# Patient Record
Sex: Male | Born: 1935 | Race: White | Hispanic: No | Marital: Married | Smoking: Never smoker
Health system: Southern US, Community
[De-identification: ages and names within clinical notes are randomized; demographics above are authoritative.]

## PROBLEM LIST (undated history)

## (undated) DIAGNOSIS — F0280 Dementia in other diseases classified elsewhere without behavioral disturbance: Secondary | ICD-10-CM

## (undated) DIAGNOSIS — E039 Hypothyroidism, unspecified: Secondary | ICD-10-CM

## (undated) DIAGNOSIS — I2510 Atherosclerotic heart disease of native coronary artery without angina pectoris: Secondary | ICD-10-CM

## (undated) DIAGNOSIS — M8580 Other specified disorders of bone density and structure, unspecified site: Secondary | ICD-10-CM

## (undated) DIAGNOSIS — K219 Gastro-esophageal reflux disease without esophagitis: Secondary | ICD-10-CM

## (undated) DIAGNOSIS — G309 Alzheimer's disease, unspecified: Secondary | ICD-10-CM

## (undated) DIAGNOSIS — I10 Essential (primary) hypertension: Secondary | ICD-10-CM

## (undated) HISTORY — DX: Hypothyroidism, unspecified: E03.9

## (undated) HISTORY — DX: Dementia in other diseases classified elsewhere without behavioral disturbance: F02.80

## (undated) HISTORY — DX: Atherosclerotic heart disease of native coronary artery without angina pectoris: I25.10

## (undated) HISTORY — DX: Other specified disorders of bone density and structure, unspecified site: M85.80

## (undated) HISTORY — DX: Gastro-esophageal reflux disease without esophagitis: K21.9

## (undated) HISTORY — DX: Essential (primary) hypertension: I10

## (undated) HISTORY — DX: Alzheimer's disease, unspecified: G30.9

---

## 1976-12-22 HISTORY — PX: CAROTID BODY TUMOR EXCISION: SHX5156

## 1977-12-22 HISTORY — PX: VASECTOMY: SHX75

## 1978-12-22 HISTORY — PX: OTHER SURGICAL HISTORY: SHX169

## 1992-12-22 HISTORY — PX: PROSTATECTOMY: SHX69

## 1997-09-21 HISTORY — PX: CORONARY ARTERY BYPASS GRAFT: SHX141

## 2000-01-27 ENCOUNTER — Observation Stay (HOSPITAL_COMMUNITY): Admission: EM | Admit: 2000-01-27 | Discharge: 2000-01-27 | Payer: Self-pay | Admitting: Cardiology

## 2001-04-05 ENCOUNTER — Other Ambulatory Visit: Admission: RE | Admit: 2001-04-05 | Discharge: 2001-04-05 | Payer: Self-pay | Admitting: Internal Medicine

## 2001-04-05 ENCOUNTER — Encounter (INDEPENDENT_AMBULATORY_CARE_PROVIDER_SITE_OTHER): Payer: Self-pay

## 2001-06-21 DIAGNOSIS — M8580 Other specified disorders of bone density and structure, unspecified site: Secondary | ICD-10-CM

## 2001-06-21 HISTORY — PX: OTHER SURGICAL HISTORY: SHX169

## 2001-06-21 HISTORY — DX: Other specified disorders of bone density and structure, unspecified site: M85.80

## 2002-12-22 HISTORY — PX: OTHER SURGICAL HISTORY: SHX169

## 2004-11-25 ENCOUNTER — Ambulatory Visit: Payer: Self-pay | Admitting: Internal Medicine

## 2004-12-22 HISTORY — PX: ESOPHAGOGASTRODUODENOSCOPY: SHX1529

## 2004-12-22 HISTORY — PX: OTHER SURGICAL HISTORY: SHX169

## 2005-01-07 ENCOUNTER — Ambulatory Visit: Payer: Self-pay

## 2005-05-16 ENCOUNTER — Ambulatory Visit: Payer: Self-pay | Admitting: Internal Medicine

## 2005-06-03 ENCOUNTER — Ambulatory Visit: Payer: Self-pay | Admitting: Internal Medicine

## 2005-07-03 ENCOUNTER — Ambulatory Visit: Payer: Self-pay | Admitting: Internal Medicine

## 2005-07-04 ENCOUNTER — Ambulatory Visit: Payer: Self-pay | Admitting: Internal Medicine

## 2005-07-11 ENCOUNTER — Ambulatory Visit: Payer: Self-pay | Admitting: Internal Medicine

## 2005-07-17 ENCOUNTER — Encounter: Payer: Self-pay | Admitting: Internal Medicine

## 2005-07-17 ENCOUNTER — Ambulatory Visit: Payer: Self-pay | Admitting: Internal Medicine

## 2005-09-21 HISTORY — PX: OTHER SURGICAL HISTORY: SHX169

## 2005-10-06 ENCOUNTER — Ambulatory Visit: Payer: Self-pay | Admitting: Cardiology

## 2005-10-06 ENCOUNTER — Observation Stay (HOSPITAL_COMMUNITY): Admission: EM | Admit: 2005-10-06 | Discharge: 2005-10-07 | Payer: Self-pay | Admitting: Emergency Medicine

## 2005-10-09 ENCOUNTER — Ambulatory Visit: Payer: Self-pay

## 2005-10-29 ENCOUNTER — Ambulatory Visit: Payer: Self-pay | Admitting: Cardiology

## 2005-12-29 ENCOUNTER — Ambulatory Visit: Payer: Self-pay | Admitting: Cardiology

## 2006-01-12 ENCOUNTER — Ambulatory Visit: Payer: Self-pay | Admitting: Internal Medicine

## 2006-01-20 ENCOUNTER — Ambulatory Visit: Payer: Self-pay | Admitting: Internal Medicine

## 2006-01-22 HISTORY — PX: OTHER SURGICAL HISTORY: SHX169

## 2006-06-12 ENCOUNTER — Ambulatory Visit: Payer: Self-pay | Admitting: Internal Medicine

## 2006-06-12 LAB — CONVERTED CEMR LAB: PSA: 0 ng/mL

## 2006-09-10 ENCOUNTER — Ambulatory Visit: Payer: Self-pay | Admitting: Internal Medicine

## 2006-09-21 HISTORY — PX: OTHER SURGICAL HISTORY: SHX169

## 2006-10-19 ENCOUNTER — Ambulatory Visit: Payer: Self-pay

## 2006-10-20 ENCOUNTER — Ambulatory Visit: Payer: Self-pay | Admitting: Internal Medicine

## 2006-10-23 ENCOUNTER — Ambulatory Visit: Payer: Self-pay | Admitting: Cardiology

## 2006-10-23 ENCOUNTER — Encounter: Payer: Self-pay | Admitting: Internal Medicine

## 2006-11-06 ENCOUNTER — Ambulatory Visit: Payer: Self-pay | Admitting: Internal Medicine

## 2006-11-26 ENCOUNTER — Ambulatory Visit: Payer: Self-pay | Admitting: Internal Medicine

## 2007-02-25 ENCOUNTER — Ambulatory Visit: Payer: Self-pay | Admitting: Internal Medicine

## 2007-03-05 ENCOUNTER — Ambulatory Visit: Payer: Self-pay | Admitting: Family Medicine

## 2007-04-01 ENCOUNTER — Encounter: Payer: Self-pay | Admitting: Internal Medicine

## 2007-04-01 DIAGNOSIS — M949 Disorder of cartilage, unspecified: Secondary | ICD-10-CM

## 2007-04-01 DIAGNOSIS — I10 Essential (primary) hypertension: Secondary | ICD-10-CM | POA: Insufficient documentation

## 2007-04-01 DIAGNOSIS — C61 Malignant neoplasm of prostate: Secondary | ICD-10-CM

## 2007-04-01 DIAGNOSIS — M899 Disorder of bone, unspecified: Secondary | ICD-10-CM | POA: Insufficient documentation

## 2007-04-01 DIAGNOSIS — I2510 Atherosclerotic heart disease of native coronary artery without angina pectoris: Secondary | ICD-10-CM | POA: Insufficient documentation

## 2007-04-01 DIAGNOSIS — E7800 Pure hypercholesterolemia, unspecified: Secondary | ICD-10-CM | POA: Insufficient documentation

## 2007-04-01 DIAGNOSIS — E039 Hypothyroidism, unspecified: Secondary | ICD-10-CM | POA: Insufficient documentation

## 2007-04-04 ENCOUNTER — Other Ambulatory Visit: Payer: Self-pay

## 2007-04-04 ENCOUNTER — Emergency Department: Payer: Medicare Other | Admitting: Emergency Medicine

## 2007-04-22 ENCOUNTER — Telehealth: Payer: Self-pay | Admitting: Internal Medicine

## 2007-04-28 ENCOUNTER — Telehealth (INDEPENDENT_AMBULATORY_CARE_PROVIDER_SITE_OTHER): Payer: Self-pay | Admitting: *Deleted

## 2007-04-28 ENCOUNTER — Ambulatory Visit: Payer: Self-pay | Admitting: Internal Medicine

## 2007-04-29 LAB — CONVERTED CEMR LAB
ALT: 20 units/L (ref 0–40)
Cholesterol: 227 mg/dL (ref 0–200)
Direct LDL: 130.4 mg/dL
HDL: 64.3 mg/dL (ref >39.0)
Total CHOL/HDL Ratio: 3.5
Triglycerides: 95 mg/dL (ref 0–149)
VLDL: 19 mg/dL (ref 0–40)

## 2007-04-30 ENCOUNTER — Telehealth: Payer: Self-pay | Admitting: Internal Medicine

## 2007-04-30 ENCOUNTER — Encounter: Payer: Self-pay | Admitting: Internal Medicine

## 2007-04-30 ENCOUNTER — Ambulatory Visit: Payer: Medicare Other | Admitting: Internal Medicine

## 2007-07-15 ENCOUNTER — Encounter: Payer: Self-pay | Admitting: Internal Medicine

## 2007-07-21 ENCOUNTER — Ambulatory Visit: Payer: Self-pay | Admitting: Internal Medicine

## 2007-07-24 ENCOUNTER — Encounter: Payer: Self-pay | Admitting: Internal Medicine

## 2007-07-26 ENCOUNTER — Encounter: Payer: Self-pay | Admitting: Internal Medicine

## 2007-07-28 ENCOUNTER — Encounter: Payer: Self-pay | Admitting: Internal Medicine

## 2007-08-02 ENCOUNTER — Encounter: Payer: Self-pay | Admitting: Internal Medicine

## 2007-08-06 ENCOUNTER — Encounter: Payer: Self-pay | Admitting: Internal Medicine

## 2007-08-09 ENCOUNTER — Encounter: Payer: Self-pay | Admitting: Internal Medicine

## 2007-08-14 ENCOUNTER — Encounter: Payer: Self-pay | Admitting: Internal Medicine

## 2007-08-20 ENCOUNTER — Encounter: Payer: Self-pay | Admitting: Internal Medicine

## 2007-08-24 ENCOUNTER — Encounter: Payer: Self-pay | Admitting: Internal Medicine

## 2007-11-11 ENCOUNTER — Encounter: Payer: Self-pay | Admitting: Internal Medicine

## 2007-11-24 ENCOUNTER — Ambulatory Visit: Payer: Self-pay | Admitting: Internal Medicine

## 2007-11-25 LAB — CONVERTED CEMR LAB
ALT: 21 units/L (ref 0–53)
Albumin: 3.9 g/dL (ref 3.5–5.2)
BUN: 8 mg/dL (ref 6–23)
CO2: 28 meq/L (ref 19–32)
Calcium: 9.5 mg/dL (ref 8.4–10.5)
Chloride: 105 meq/L (ref 96–112)
Cholesterol: 167 mg/dL (ref 0–200)
Creatinine, Ser: 0.9 mg/dL (ref 0.4–1.5)
GFR calc Af Amer: 105 mL/min
GFR calc non Af Amer: 87 mL/min
Glucose, Bld: 89 mg/dL (ref 70–99)
HDL: 59.1 mg/dL (ref >39.0)
LDL Cholesterol: 89 mg/dL (ref 0–99)
PSA: <0.03 ng/mL — ABNORMAL LOW (ref 0.10–4.00)
Phosphorus: 2.8 mg/dL (ref 2.3–4.6)
Potassium: 3.9 meq/L (ref 3.5–5.1)
Sodium: 141 meq/L (ref 135–145)
Total CHOL/HDL Ratio: 2.8
Triglycerides: 93 mg/dL (ref 0–149)
VLDL: 19 mg/dL (ref 0–40)

## 2008-01-02 ENCOUNTER — Emergency Department: Payer: Medicare Other | Admitting: Emergency Medicine

## 2008-01-02 ENCOUNTER — Other Ambulatory Visit: Payer: Self-pay

## 2008-02-11 ENCOUNTER — Ambulatory Visit: Payer: Self-pay | Admitting: Internal Medicine

## 2008-02-11 DIAGNOSIS — K219 Gastro-esophageal reflux disease without esophagitis: Secondary | ICD-10-CM | POA: Insufficient documentation

## 2008-02-22 ENCOUNTER — Telehealth (INDEPENDENT_AMBULATORY_CARE_PROVIDER_SITE_OTHER): Payer: Self-pay | Admitting: *Deleted

## 2008-02-23 ENCOUNTER — Telehealth (INDEPENDENT_AMBULATORY_CARE_PROVIDER_SITE_OTHER): Payer: Self-pay | Admitting: *Deleted

## 2008-03-23 ENCOUNTER — Ambulatory Visit: Payer: Self-pay | Admitting: Internal Medicine

## 2008-03-23 DIAGNOSIS — R109 Unspecified abdominal pain: Secondary | ICD-10-CM | POA: Insufficient documentation

## 2008-04-21 HISTORY — PX: ESOPHAGOGASTRODUODENOSCOPY: SHX1529

## 2008-04-25 ENCOUNTER — Ambulatory Visit: Payer: Self-pay | Admitting: Internal Medicine

## 2008-04-28 ENCOUNTER — Encounter: Payer: Self-pay | Admitting: Internal Medicine

## 2008-04-28 ENCOUNTER — Ambulatory Visit: Payer: Self-pay | Admitting: Internal Medicine

## 2008-05-24 ENCOUNTER — Ambulatory Visit: Payer: Self-pay | Admitting: Internal Medicine

## 2008-05-29 ENCOUNTER — Telehealth: Payer: Self-pay | Admitting: Internal Medicine

## 2008-09-28 ENCOUNTER — Ambulatory Visit: Payer: Self-pay | Admitting: Internal Medicine

## 2008-10-02 LAB — CONVERTED CEMR LAB
ALT: 18 units/L (ref 0–53)
AST: 25 units/L (ref 0–37)
Albumin: 4.2 g/dL (ref 3.5–5.2)
Alkaline Phosphatase: 58 units/L (ref 39–117)
Amylase: 92 units/L (ref 27–131)
BUN: 10 mg/dL (ref 6–23)
Basophils Absolute: 0 10*3/uL (ref 0.0–0.1)
Basophils Relative: 0 % (ref 0.0–3.0)
Bilirubin, Direct: 0.2 mg/dL (ref 0.0–0.3)
CO2: 30 meq/L (ref 19–32)
Calcium: 9.4 mg/dL (ref 8.4–10.5)
Chloride: 106 meq/L (ref 96–112)
Cholesterol: 156 mg/dL (ref 0–200)
Creatinine, Ser: 1.1 mg/dL (ref 0.4–1.5)
Eosinophils Absolute: 0.3 10*3/uL (ref 0.0–0.7)
Eosinophils Relative: 3.1 % (ref 0.0–5.0)
Free T4: 0.8 ng/dL (ref 0.6–1.6)
GFR calc Af Amer: 83 mL/min
GFR calc non Af Amer: 69 mL/min
Glucose, Bld: 89 mg/dL (ref 70–99)
HCT: 38.8 % — ABNORMAL LOW (ref 39.0–52.0)
HDL: 58.7 mg/dL (ref >39.0)
Hemoglobin: 13.2 g/dL (ref 13.0–17.0)
LDL Cholesterol: 73 mg/dL (ref 0–99)
Lipase: 32 units/L (ref 11.0–59.0)
Lymphocytes Relative: 20.7 % (ref 12.0–46.0)
MCHC: 34 g/dL (ref 30.0–36.0)
MCV: 96.3 fL (ref 78.0–100.0)
Monocytes Absolute: 0.5 10*3/uL (ref 0.1–1.0)
Monocytes Relative: 5.2 % (ref 3.0–12.0)
Neutro Abs: 6.3 10*3/uL (ref 1.4–7.7)
Neutrophils Relative %: 71 % (ref 43.0–77.0)
PSA: 0 ng/mL — ABNORMAL LOW (ref 0.10–4.00)
Phosphorus: 2.6 mg/dL (ref 2.3–4.6)
Platelets: 219 10*3/uL (ref 150–400)
Potassium: 3.8 meq/L (ref 3.5–5.1)
RBC: 4.03 M/uL — ABNORMAL LOW (ref 4.22–5.81)
RDW: 12.6 % (ref 11.5–14.6)
Sodium: 142 meq/L (ref 135–145)
TSH: 2.97 microintl units/mL (ref 0.35–5.50)
Total Bilirubin: 0.4 mg/dL (ref 0.3–1.2)
Total CHOL/HDL Ratio: 2.7
Total Protein: 6.8 g/dL (ref 6.0–8.3)
Triglycerides: 120 mg/dL (ref 0–149)
VLDL: 24 mg/dL (ref 0–40)
WBC: 9 10*3/uL (ref 4.5–10.5)

## 2008-10-04 ENCOUNTER — Ambulatory Visit: Payer: Self-pay | Admitting: Internal Medicine

## 2008-10-04 DIAGNOSIS — R51 Headache: Secondary | ICD-10-CM | POA: Insufficient documentation

## 2008-10-04 DIAGNOSIS — R519 Headache, unspecified: Secondary | ICD-10-CM | POA: Insufficient documentation

## 2009-01-13 IMAGING — CR DG CHEST 2V
1 series · 2 of 2 positions shown · non-contrast
Comparison: none

REASON FOR EXAM: RIGHT SIDE PAIN - RM 15
COMMENTS:

PROCEDURE:     DXR - DXR CHEST PA (OR AP) AND LATERAL  - April 04, 2007  [DATE]
RESULT:     Two views of the chest show degenerative changes in the spine.
CABG changes are present. COPD changes are present. There is scoliosis
concave to the LEFT. The bones are osteopenic.

[Series 1: view not recorded · 0.17mm/px · 2 of 2 slices shown]
[im 1/2]
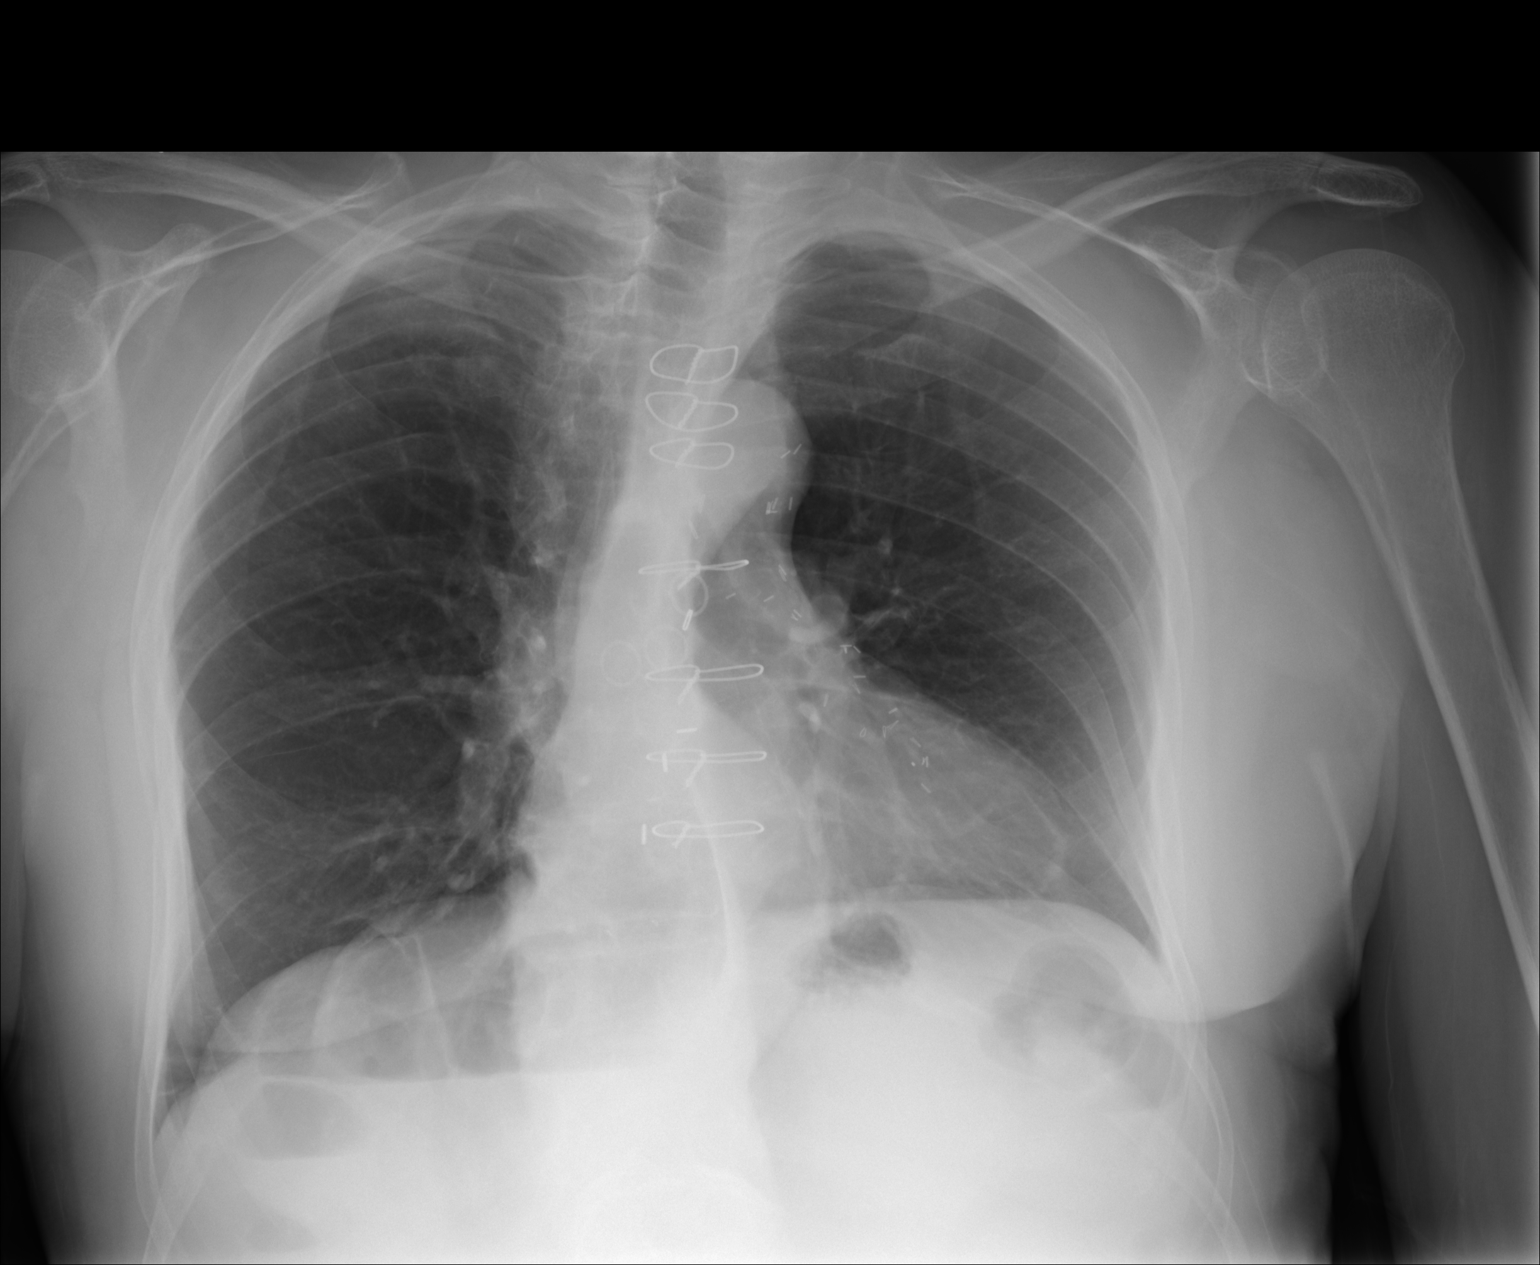
[im 2/2]
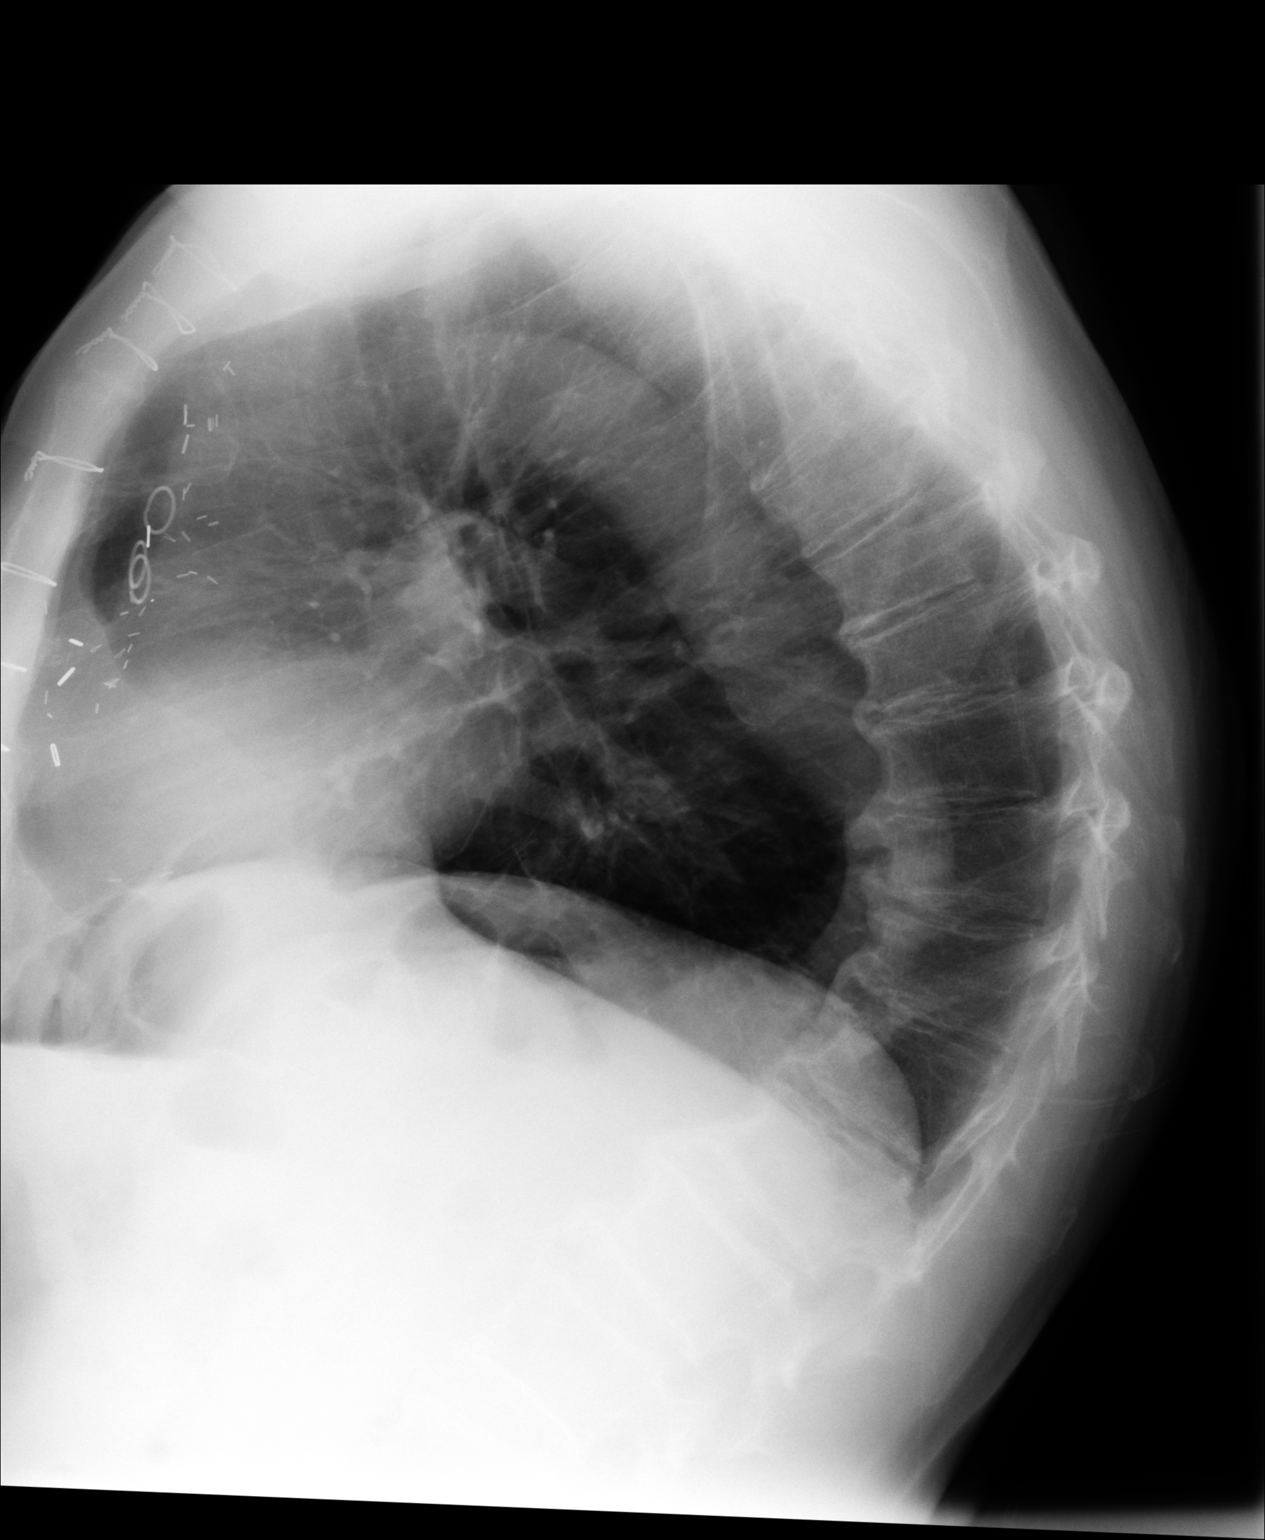

[2 of 2 positions shown; findings below may reference images not displayed]

IMPRESSION: 1. Scoliosis.
2. COPD.
3. CABG changes.
4. No acute abnormality evident.

## 2009-01-13 IMAGING — US ABDOMEN ULTRASOUND
1 series · 17 of 25 positions shown · non-contrast
Comparison: none

REASON FOR EXAM: RUQ PAIN, NPO SINCE MN; PLEASE ALSO EVALUATE ABDOMINAL
AORTA
COMMENTS:

[Series 1: abdomen ultrasound · 17 of 57 slices shown]
[im 1/57]
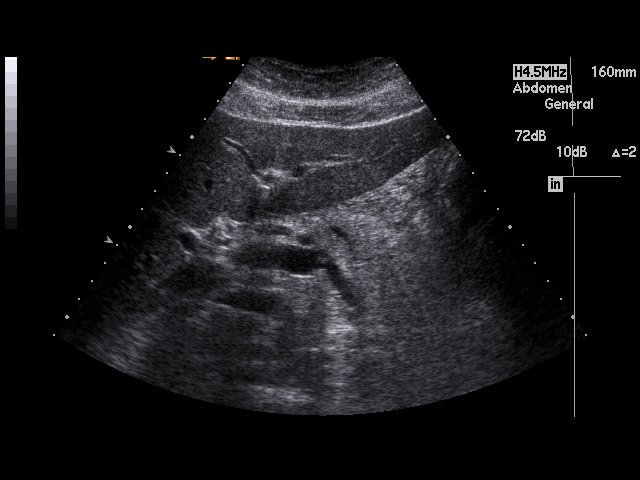
[im 5/57]
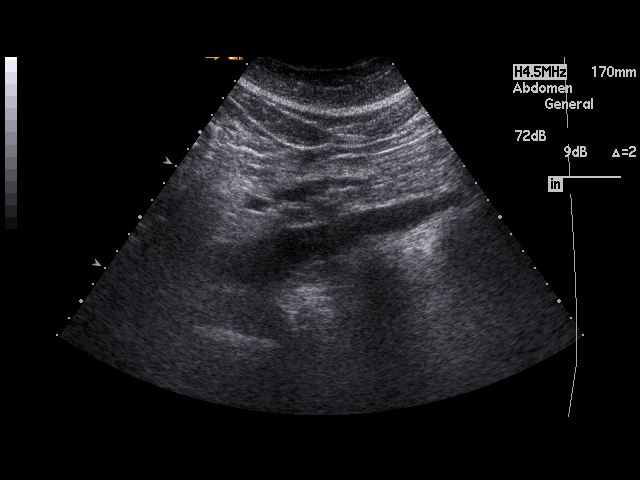
[im 8/57]
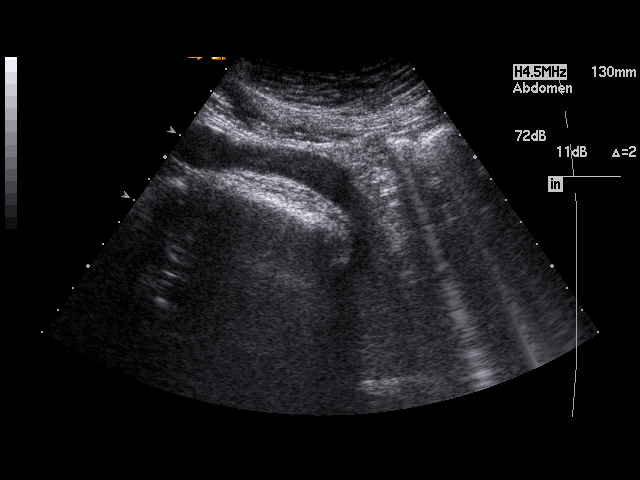
[im 12/57]
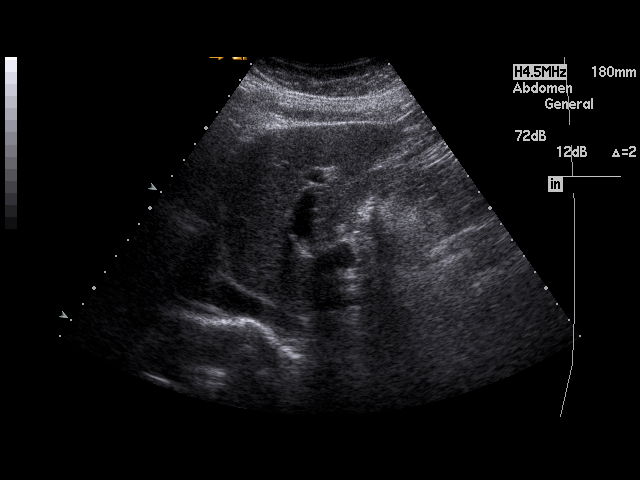
[im 15/57]
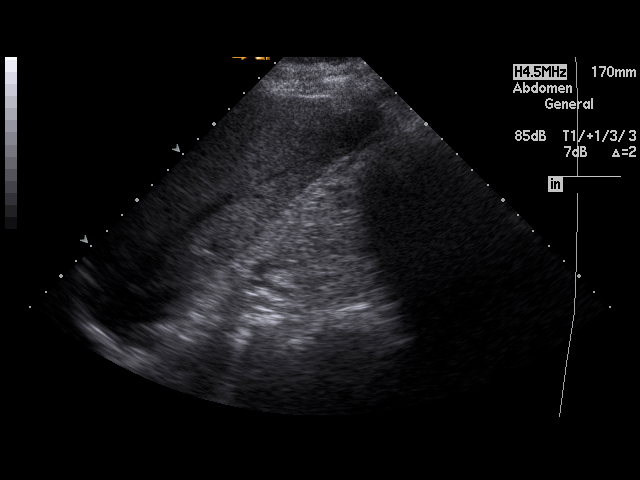
[im 19/57]
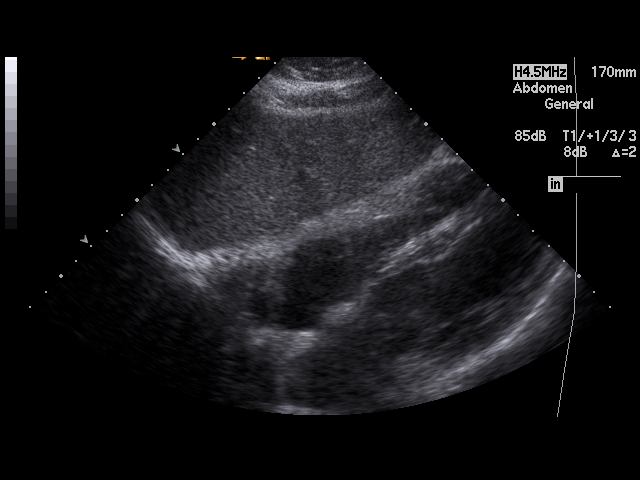
[im 22/57]
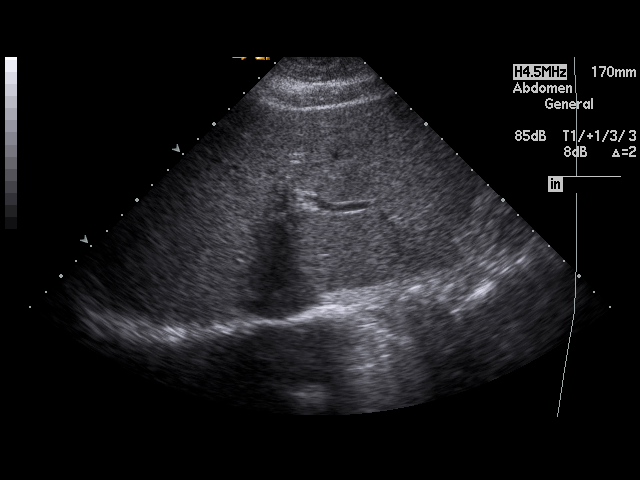
[im 26/57]
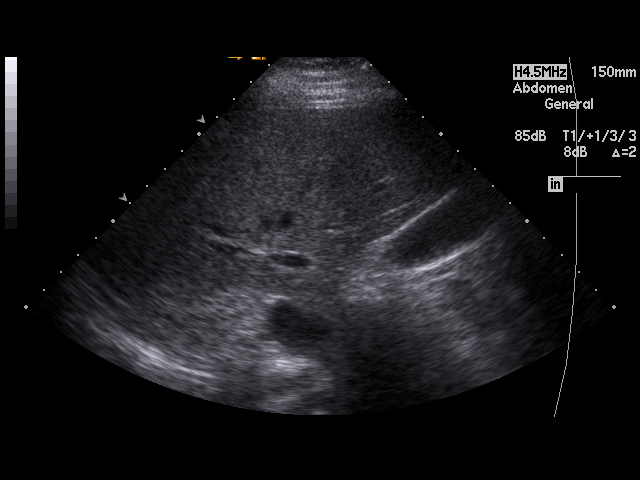
[im 29/57]
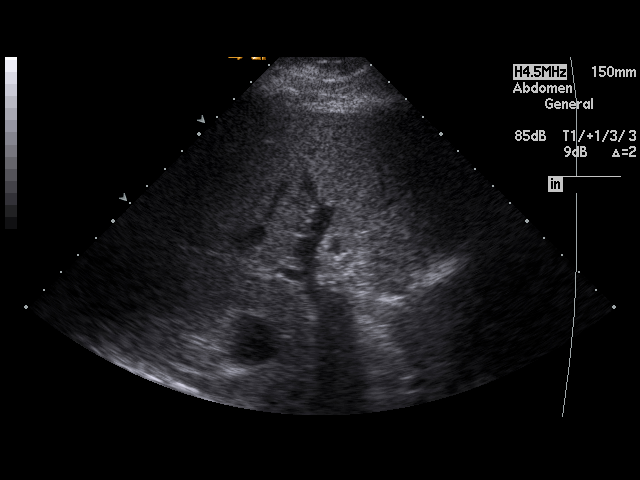
[im 31/57]
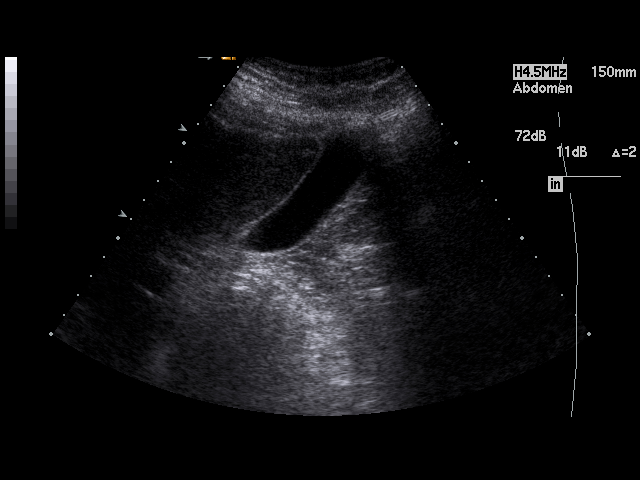
[im 36/57]
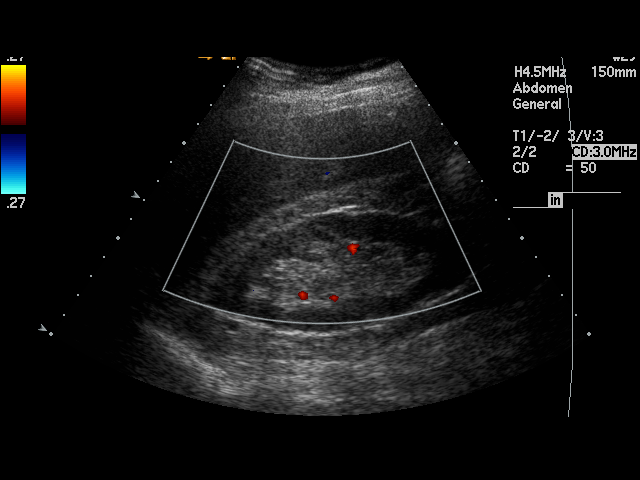
[im 38/57]
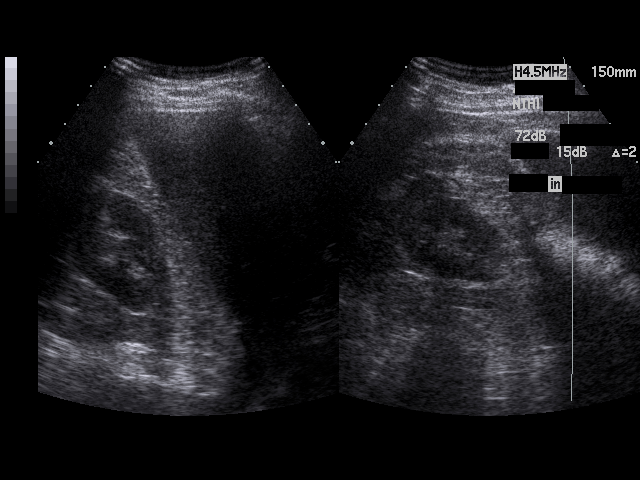
[im 43/57]
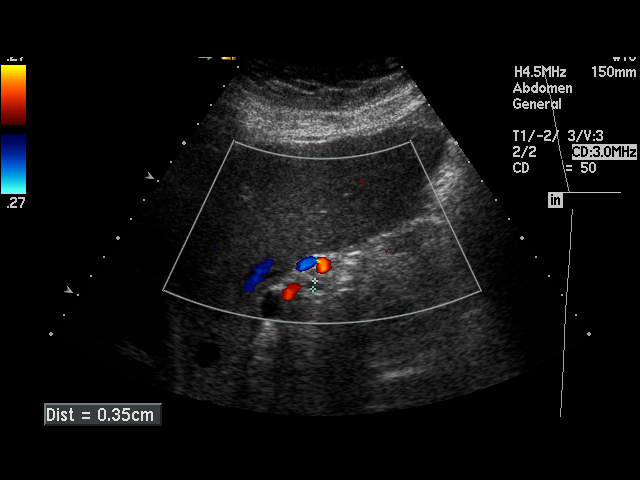
[im 45/57]
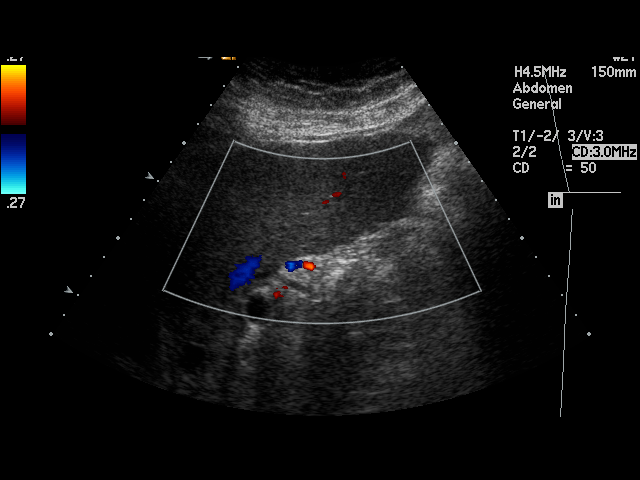
[im 50/57]
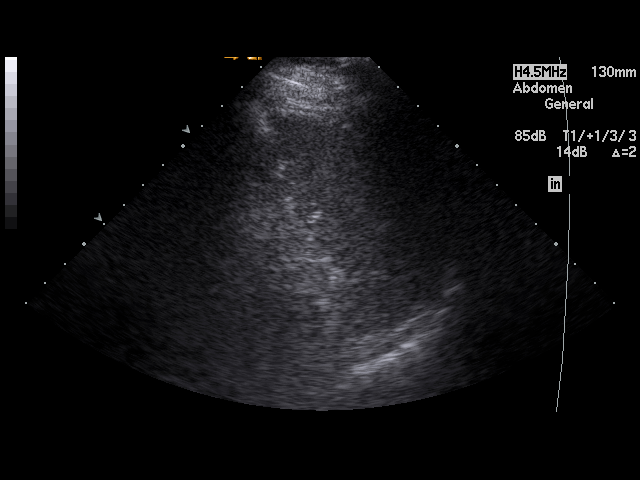
[im 52/57]
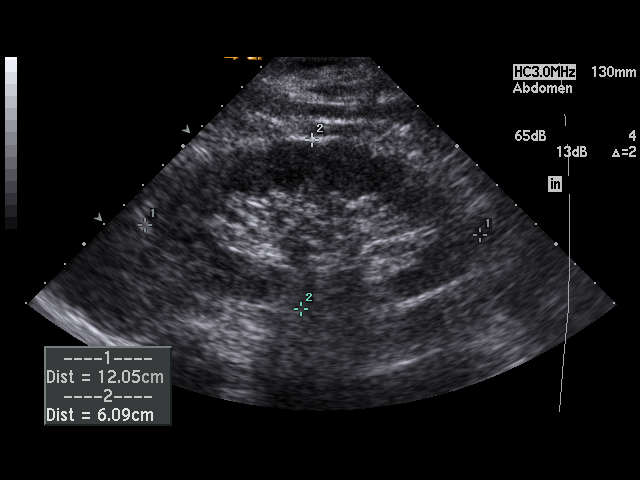
[im 57/57]
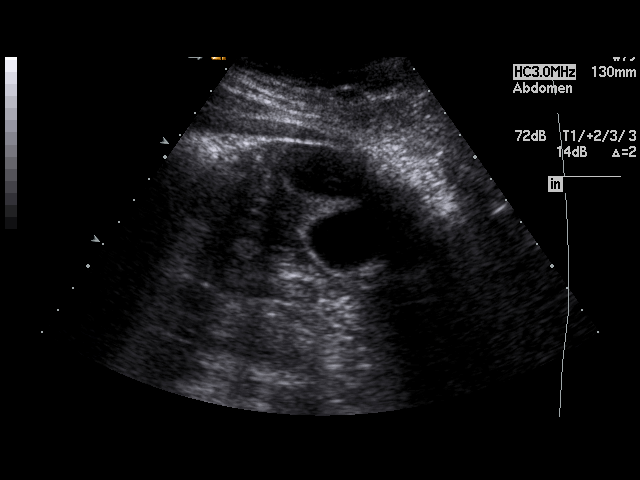

[17 of 25 positions shown; findings below may reference images not displayed]

PROCEDURE:     US  - US ABDOMEN GENERAL SURVEY  - April 04, 2007  [DATE]

RESULT:     The liver exhibits normal echotexture with no evidence of mass
or of ductal dilation. Portal venous flow is normal in direction. Partial
visualization of the pancreas reveals no acute abnormality. The pancreatic
tail is obscured by bowel gas. The gallbladder is adequately distended and
exhibits a phrygian cap, which is a normal variant. There is no sonographic
Murphy's sign and there are no stones identified. The common bile duct
measures 3.5 mm in diameter. The spleen, abdominal aorta, and kidneys are
normal in appearance. There is no evidence of ascites.
IMPRESSION: 1. I see no evidence of gallstones or other acute hepatobiliary abnormality.
2. There is no evidence of an abdominal aortic aneurysm. The maximal
measured diameter of the abdominal aorta is approximately 2 cm.

The findings were called to the [HOSPITAL] the conclusion of
the study.

## 2009-02-12 ENCOUNTER — Telehealth: Payer: Self-pay | Admitting: Internal Medicine

## 2009-02-23 ENCOUNTER — Ambulatory Visit: Payer: Self-pay | Admitting: Internal Medicine

## 2009-02-23 DIAGNOSIS — G309 Alzheimer's disease, unspecified: Secondary | ICD-10-CM

## 2009-02-23 DIAGNOSIS — F0280 Dementia in other diseases classified elsewhere without behavioral disturbance: Secondary | ICD-10-CM

## 2009-05-11 ENCOUNTER — Telehealth: Payer: Self-pay | Admitting: Family Medicine

## 2009-06-08 ENCOUNTER — Encounter: Payer: Self-pay | Admitting: Internal Medicine

## 2009-06-08 ENCOUNTER — Telehealth (INDEPENDENT_AMBULATORY_CARE_PROVIDER_SITE_OTHER): Payer: Self-pay | Admitting: *Deleted

## 2009-06-08 ENCOUNTER — Emergency Department: Payer: Medicare Other | Admitting: Emergency Medicine

## 2009-06-13 ENCOUNTER — Ambulatory Visit: Payer: Self-pay | Admitting: Internal Medicine

## 2009-06-27 ENCOUNTER — Ambulatory Visit: Payer: Self-pay | Admitting: Internal Medicine

## 2009-06-27 DIAGNOSIS — N529 Male erectile dysfunction, unspecified: Secondary | ICD-10-CM | POA: Insufficient documentation

## 2009-06-27 DIAGNOSIS — B354 Tinea corporis: Secondary | ICD-10-CM | POA: Insufficient documentation

## 2009-07-02 LAB — CONVERTED CEMR LAB
ALT: 21 units/L (ref 0–53)
AST: 32 units/L (ref 0–37)
Albumin: 4 g/dL (ref 3.5–5.2)
Alkaline Phosphatase: 58 units/L (ref 39–117)
BUN: 16 mg/dL (ref 6–23)
Basophils Absolute: 0 10*3/uL (ref 0.0–0.1)
Basophils Relative: 0.2 % (ref 0.0–3.0)
Bilirubin, Direct: 0.1 mg/dL (ref 0.0–0.3)
CO2: 27 meq/L (ref 19–32)
Calcium: 9.2 mg/dL (ref 8.4–10.5)
Chloride: 107 meq/L (ref 96–112)
Cholesterol: 158 mg/dL (ref 0–200)
Creatinine, Ser: 0.8 mg/dL (ref 0.4–1.5)
Eosinophils Absolute: 0.3 10*3/uL (ref 0.0–0.7)
Eosinophils Relative: 3.1 % (ref 0.0–5.0)
Free T4: 1.3 ng/dL (ref 0.6–1.6)
Glucose, Bld: 76 mg/dL (ref 70–99)
HCT: 39 % (ref 39.0–52.0)
HDL: 68.2 mg/dL (ref >39.00)
Hemoglobin: 13.4 g/dL (ref 13.0–17.0)
LDL Cholesterol: 74 mg/dL (ref 0–99)
Lymphocytes Relative: 20 % (ref 12.0–46.0)
Lymphs Abs: 1.7 10*3/uL (ref 0.7–4.0)
MCHC: 34.4 g/dL (ref 30.0–36.0)
MCV: 96.8 fL (ref 78.0–100.0)
Monocytes Absolute: 1 10*3/uL (ref 0.1–1.0)
Monocytes Relative: 12.1 % — ABNORMAL HIGH (ref 3.0–12.0)
Neutro Abs: 5.3 10*3/uL (ref 1.4–7.7)
Neutrophils Relative %: 64.6 % (ref 43.0–77.0)
PSA: 0.01 ng/mL — ABNORMAL LOW (ref 0.10–4.00)
Phosphorus: 3.5 mg/dL (ref 2.3–4.6)
Platelets: 207 10*3/uL (ref 150.0–400.0)
Potassium: 4.6 meq/L (ref 3.5–5.1)
RBC: 4.03 M/uL — ABNORMAL LOW (ref 4.22–5.81)
RDW: 13 % (ref 11.5–14.6)
Sodium: 141 meq/L (ref 135–145)
TSH: 0.66 microintl units/mL (ref 0.35–5.50)
Total Bilirubin: 0.9 mg/dL (ref 0.3–1.2)
Total CHOL/HDL Ratio: 2
Total Protein: 6.5 g/dL (ref 6.0–8.3)
Triglycerides: 81 mg/dL (ref 0.0–149.0)
VLDL: 16.2 mg/dL (ref 0.0–40.0)
WBC: 8.3 10*3/uL (ref 4.5–10.5)

## 2009-08-03 ENCOUNTER — Telehealth: Payer: Self-pay | Admitting: Internal Medicine

## 2009-10-13 IMAGING — CR DG CHEST 1V PORT
1 series · 1 of 1 positions shown · non-contrast
Comparison: none

REASON FOR EXAM: Chills, slight cough
COMMENTS:

PROCEDURE:     DXR - DXR PORTABLE CHEST SINGLE VIEW  - January 02, 2008  [DATE]
RESULT:     Cardiomegaly is present. The patient has had prior CABG.
Scoliosis of the thoracic spine is noted. The chest is stable from
04/04/2007.

[view not recorded]
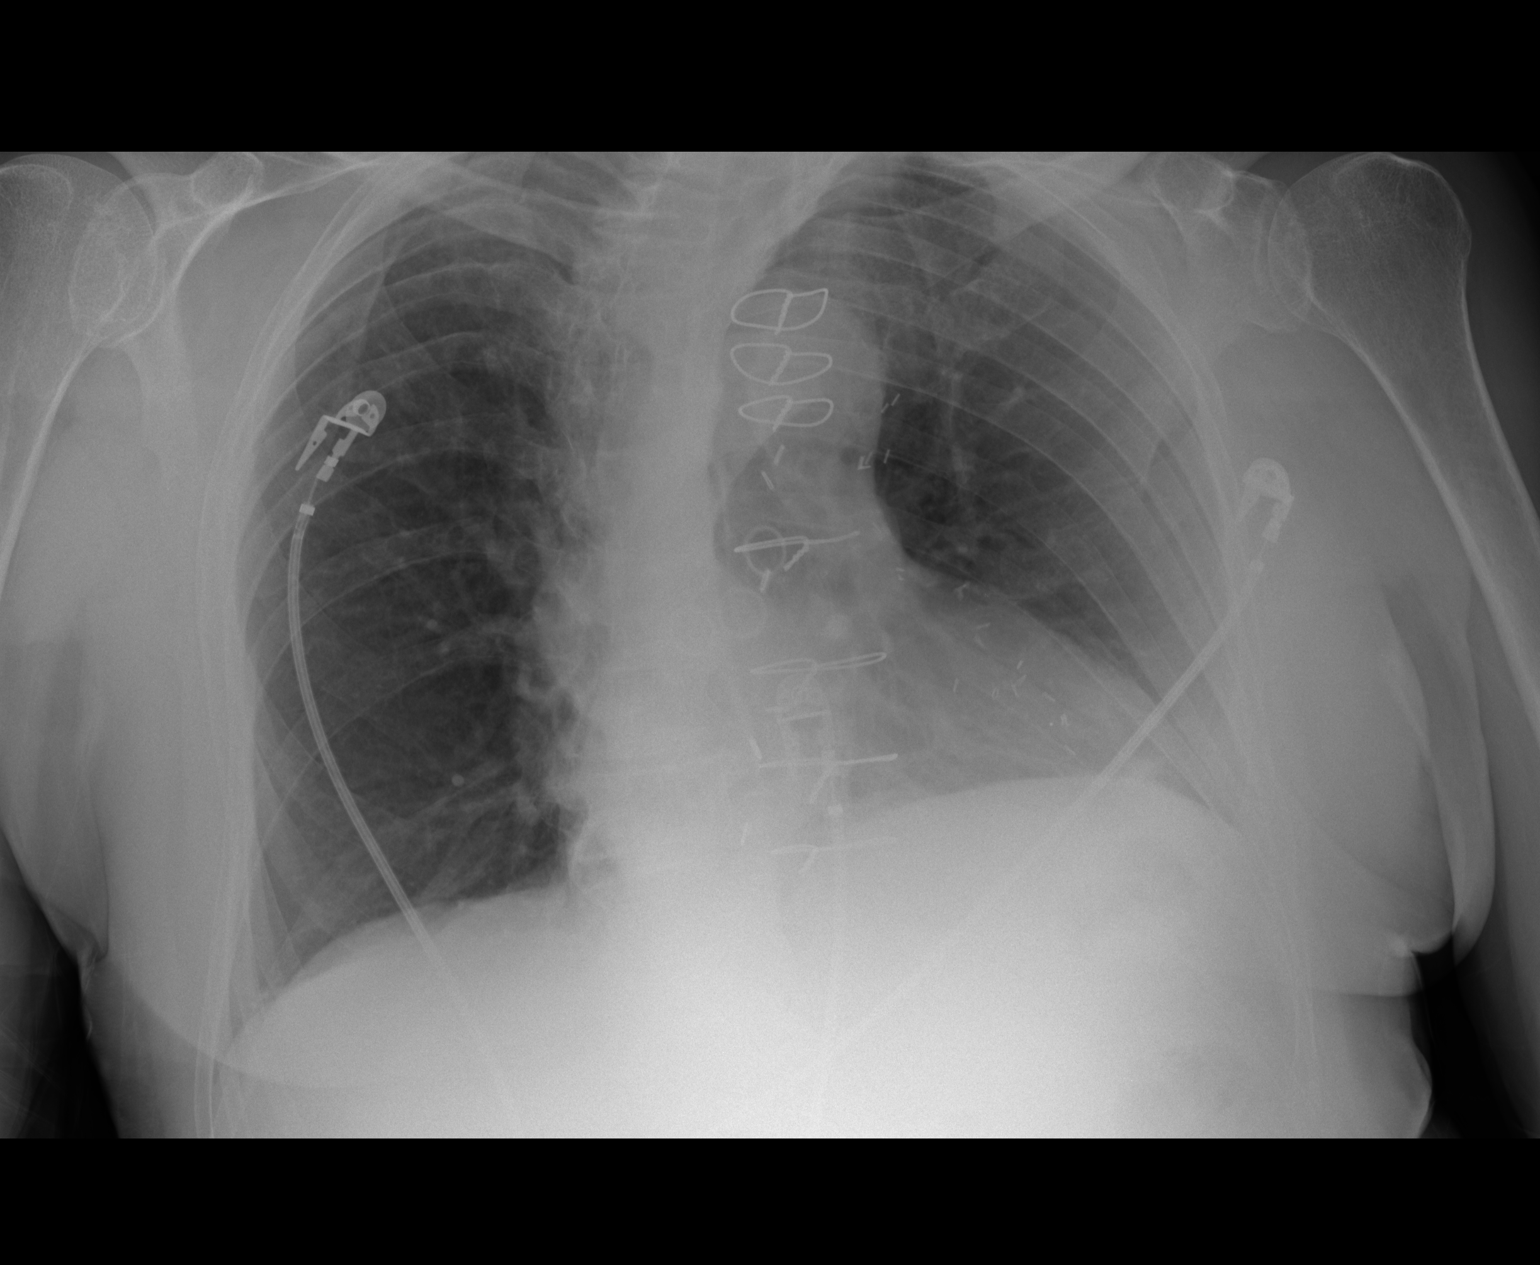

[1 of 1 positions shown; findings below may reference images not displayed]

IMPRESSION: 1.     No acute cardiopulmonary disease.
2.     The patient has had prior CABG.
3.     Scoliosis of the thoracic spine.
4.     Stable chest from prior study of 04/04/2007.

## 2009-11-02 ENCOUNTER — Encounter: Payer: Self-pay | Admitting: Internal Medicine

## 2009-11-07 ENCOUNTER — Ambulatory Visit: Payer: Self-pay | Admitting: Internal Medicine

## 2010-01-16 ENCOUNTER — Telehealth: Payer: Self-pay | Admitting: Internal Medicine

## 2010-03-29 ENCOUNTER — Ambulatory Visit: Payer: Self-pay | Admitting: Internal Medicine

## 2010-03-31 LAB — CONVERTED CEMR LAB
ALT: 14 units/L (ref 0–53)
AST: 21 units/L (ref 0–37)
Albumin: 4.1 g/dL (ref 3.5–5.2)
Alkaline Phosphatase: 63 units/L (ref 39–117)
Bilirubin, Direct: 0 mg/dL (ref 0.0–0.3)
Total Bilirubin: 0.5 mg/dL (ref 0.3–1.2)
Total Protein: 6.8 g/dL (ref 6.0–8.3)

## 2010-04-01 LAB — CONVERTED CEMR LAB
BUN: 12 mg/dL (ref 6–23)
Basophils Absolute: 0.1 10*3/uL (ref 0.0–0.1)
Basophils Relative: 0.7 % (ref 0.0–3.0)
CO2: 30 meq/L (ref 19–32)
Calcium: 9.3 mg/dL (ref 8.4–10.5)
Chloride: 106 meq/L (ref 96–112)
Cholesterol: 134 mg/dL (ref 0–200)
Creatinine, Ser: 0.9 mg/dL (ref 0.4–1.5)
Eosinophils Absolute: 0.1 10*3/uL (ref 0.0–0.7)
Eosinophils Relative: 1.8 % (ref 0.0–5.0)
Free T4: 1.1 ng/dL (ref 0.6–1.6)
GFR calc non Af Amer: 86.23 mL/min (ref >60)
Glucose, Bld: 97 mg/dL (ref 70–99)
HCT: 38.6 % — ABNORMAL LOW (ref 39.0–52.0)
HDL: 61.2 mg/dL (ref >39.00)
Hemoglobin: 13.2 g/dL (ref 13.0–17.0)
LDL Cholesterol: 53 mg/dL (ref 0–99)
Lymphocytes Relative: 15.6 % (ref 12.0–46.0)
Lymphs Abs: 1.3 10*3/uL (ref 0.7–4.0)
MCHC: 34.2 g/dL (ref 30.0–36.0)
MCV: 97 fL (ref 78.0–100.0)
Monocytes Absolute: 0.8 10*3/uL (ref 0.1–1.0)
Monocytes Relative: 9.9 % (ref 3.0–12.0)
Neutro Abs: 5.9 10*3/uL (ref 1.4–7.7)
Neutrophils Relative %: 72 % (ref 43.0–77.0)
PSA: 0.01 ng/mL — ABNORMAL LOW (ref 0.10–4.00)
Phosphorus: 3.3 mg/dL (ref 2.3–4.6)
Platelets: 217 10*3/uL (ref 150.0–400.0)
Potassium: 4.4 meq/L (ref 3.5–5.1)
RBC: 3.98 M/uL — ABNORMAL LOW (ref 4.22–5.81)
RDW: 13.6 % (ref 11.5–14.6)
Sodium: 143 meq/L (ref 135–145)
TSH: 0.95 microintl units/mL (ref 0.35–5.50)
Total CHOL/HDL Ratio: 2
Triglycerides: 99 mg/dL (ref 0.0–149.0)
VLDL: 19.8 mg/dL (ref 0.0–40.0)
WBC: 8.1 10*3/uL (ref 4.5–10.5)

## 2010-04-03 ENCOUNTER — Telehealth: Payer: Self-pay | Admitting: Internal Medicine

## 2010-04-09 ENCOUNTER — Encounter: Payer: Self-pay | Admitting: Internal Medicine

## 2010-04-16 ENCOUNTER — Telehealth: Payer: Self-pay | Admitting: Internal Medicine

## 2010-04-17 ENCOUNTER — Ambulatory Visit: Payer: Self-pay | Admitting: Internal Medicine

## 2010-05-02 ENCOUNTER — Ambulatory Visit: Payer: Self-pay | Admitting: Internal Medicine

## 2010-05-02 DIAGNOSIS — M549 Dorsalgia, unspecified: Secondary | ICD-10-CM | POA: Insufficient documentation

## 2010-07-01 ENCOUNTER — Ambulatory Visit: Payer: Self-pay | Admitting: Internal Medicine

## 2010-07-16 ENCOUNTER — Telehealth: Payer: Self-pay | Admitting: Internal Medicine

## 2010-07-16 ENCOUNTER — Ambulatory Visit: Payer: Self-pay | Admitting: Internal Medicine

## 2010-10-15 ENCOUNTER — Encounter: Payer: Self-pay | Admitting: Internal Medicine

## 2010-10-16 ENCOUNTER — Encounter: Payer: Self-pay | Admitting: Internal Medicine

## 2010-11-07 ENCOUNTER — Encounter: Payer: Self-pay | Admitting: Internal Medicine

## 2010-11-08 ENCOUNTER — Ambulatory Visit: Payer: Self-pay | Admitting: Internal Medicine

## 2010-11-11 LAB — CONVERTED CEMR LAB
ALT: 13 units/L (ref 0–53)
AST: 23 units/L (ref 0–37)
Albumin: 3.8 g/dL (ref 3.5–5.2)
Alkaline Phosphatase: 68 units/L (ref 39–117)
BUN: 16 mg/dL (ref 6–23)
Basophils Absolute: 0 10*3/uL (ref 0.0–0.1)
Basophils Relative: 0.6 % (ref 0.0–3.0)
Bilirubin, Direct: 0.1 mg/dL (ref 0.0–0.3)
CO2: 26 meq/L (ref 19–32)
Calcium: 9.2 mg/dL (ref 8.4–10.5)
Chloride: 104 meq/L (ref 96–112)
Cholesterol: 138 mg/dL (ref 0–200)
Creatinine, Ser: 0.9 mg/dL (ref 0.4–1.5)
Eosinophils Absolute: 0.2 10*3/uL (ref 0.0–0.7)
Eosinophils Relative: 2.6 % (ref 0.0–5.0)
Free T4: 1.24 ng/dL (ref 0.60–1.60)
GFR calc non Af Amer: 83.94 mL/min (ref >60)
Glucose, Bld: 88 mg/dL (ref 70–99)
HCT: 35.2 % — ABNORMAL LOW (ref 39.0–52.0)
HDL: 63.8 mg/dL (ref >39.00)
Hemoglobin: 11.7 g/dL — ABNORMAL LOW (ref 13.0–17.0)
LDL Cholesterol: 61 mg/dL (ref 0–99)
Lymphocytes Relative: 16.9 % (ref 12.0–46.0)
Lymphs Abs: 1.3 10*3/uL (ref 0.7–4.0)
MCHC: 33.2 g/dL (ref 30.0–36.0)
MCV: 96.3 fL (ref 78.0–100.0)
Monocytes Absolute: 0.9 10*3/uL (ref 0.1–1.0)
Monocytes Relative: 11.7 % (ref 3.0–12.0)
Neutro Abs: 5.2 10*3/uL (ref 1.4–7.7)
Neutrophils Relative %: 68.2 % (ref 43.0–77.0)
Phosphorus: 3.3 mg/dL (ref 2.3–4.6)
Platelets: 216 10*3/uL (ref 150.0–400.0)
Potassium: 4.1 meq/L (ref 3.5–5.1)
RBC: 3.65 M/uL — ABNORMAL LOW (ref 4.22–5.81)
RDW: 15.9 % — ABNORMAL HIGH (ref 11.5–14.6)
Sodium: 140 meq/L (ref 135–145)
TSH: 0.53 microintl units/mL (ref 0.35–5.50)
Total Bilirubin: 0.7 mg/dL (ref 0.3–1.2)
Total CHOL/HDL Ratio: 2
Total Protein: 6.1 g/dL (ref 6.0–8.3)
Triglycerides: 66 mg/dL (ref 0.0–149.0)
VLDL: 13.2 mg/dL (ref 0.0–40.0)
WBC: 7.7 10*3/uL (ref 4.5–10.5)

## 2010-11-20 ENCOUNTER — Telehealth: Payer: Self-pay | Admitting: Internal Medicine

## 2011-01-09 DIAGNOSIS — F0280 Dementia in other diseases classified elsewhere without behavioral disturbance: Secondary | ICD-10-CM

## 2011-01-09 DIAGNOSIS — E785 Hyperlipidemia, unspecified: Secondary | ICD-10-CM

## 2011-01-09 DIAGNOSIS — G479 Sleep disorder, unspecified: Secondary | ICD-10-CM

## 2011-01-09 DIAGNOSIS — G309 Alzheimer's disease, unspecified: Secondary | ICD-10-CM

## 2011-01-09 DIAGNOSIS — I2510 Atherosclerotic heart disease of native coronary artery without angina pectoris: Secondary | ICD-10-CM

## 2011-01-21 NOTE — Assessment & Plan Note (Signed)
Summary: ROA FOR 4 MONTH FOLLOW-UP/JRR   Vital Signs:  Patient profile:   77 year old male Weight:      160 pounds Temp:     97.5 degrees F oral Pulse rate:   68 / minute Pulse rhythm:   regular BP sitting:   148 / 80  (left arm) Cuff size:   large  Vitals Entered By: Mervin Hack CMA Duncan Dull) (July 01, 2010 11:34 AM) CC: 4 month follow-up   History of Present Illness: Moving to [facility] in the next 6 months Offered house to his son at 1/2 of value (for wife's share) he didn't want it Now they want to donate it to the city of [place] as a historical place Attorney needs to be sure he can make these decisions  Back is better still getting aleve daily  Going to [place] still  Has some spells of acid reflux can be concerning for heart problems but have been consistent  No chest pain  No SOB  Allergies: 1)  Aricept (Donepezil Hcl)  Past History:  Past medical, surgical, family and social histories (including risk factors) reviewed for relevance to current acute and chronic problems.  Past Medical History: Reviewed history from 02/23/2009 and no changes required. Coronary artery disease----------------------Dr Wall Hypertension Hypothyroidism Osteopenia 7/02 Altzheimers GERD  Past Surgical History: Reviewed history from 05/24/2008 and no changes required. Stress cardiolite normal EF 70% 1/04 Right carotid tumor- benign 1978 Nasal lesion 1980 Total prostatectomy 1994 Vasectomy 1979 CABG 10/98 Stress cardiolite neg. 2/07 DEXA 7/02 EGD with mild narrowing--Dr Marina Goodell  2006 Cardiolite normal 1/04 Cardiolite neg. 1/06 Overnight admit CP Stress myoview neg. EF 70% 10/06 Adenosine myoview- normal EF 64% 10/07 EGD/dilatation 5/09 Dr Marina Goodell  Family History: Reviewed history from 04/01/2007 and no changes required. Dad died of heart problems Mom had no sig health issues 1 sib with TB  Social History: Reviewed history from 04/01/2007 and no changes  required. Retired--from [employer] Married-2nd      2 children from 1st marriage Never Smoked Alcohol use-yes--rare  Review of Systems       appetite is off some---wife adding ensure with ice cream weight down 4# sleeps okay  Physical Exam  General:  alert and normal appearance.   Neck:  supple, no masses, no thyromegaly, no carotid bruits, and no cervical lymphadenopathy.   Lungs:  normal respiratory effort and normal breath sounds.   Heart:  normal rate, regular rhythm, no murmur, and no gallop.   Extremities:  no edema Neurologic:  confused can't really relate any details about wills or the decisions about their house that he and his wife have formerly discussed Psych:  normally interactive, good eye contact, not anxious appearing, and not depressed appearing.     Impression & Recommendations:  Problem # 1:  ALZHEIMERS DISEASE (ICD-331.0) Assessment Deteriorated continued worsening [place] from [place]--will likely need inpatient care at [facility] within the next year  Problem # 2:  BACK PAIN (ICD-724.5) Assessment: Improved pain seems better still on the aleve  His updated medication list for this problem includes:    Hydrocodone-acetaminophen 5-325 Mg Tabs (Hydrocodone-acetaminophen) .Marland Kitchen... 1  tab  three times a day as needed for severe  pain    Aspirin 81 Mg Tbec (Aspirin) .Marland Kitchen... Take 1 tablet by mouth once daily    Aleve 220 Mg Tabs (Naproxen sodium) .Marland Kitchen... Take 1 by mouth two times a day  Problem # 3:  ESOPHAGEAL STRICTURE (EGD) 7/06 (ICD-530.3) Assessment: Unchanged occ  symptoms which are self limited  Problem # 4:  HYPERTENSION (ICD-401.9) Assessment: Unchanged reasonable control no changes for now  His updated medication list for this problem includes:    Altace 2.5 Mg Caps (Ramipril) .Marland Kitchen... Take 1 capsule by mouth once a day  BP today: 148/80 Prior BP: 148/80 (05/02/2010)  Labs Reviewed: K+: 4.4 (03/29/2010) Creat: : 0.9 (03/29/2010)    Chol: 134 (03/29/2010)   HDL: 61.20 (03/29/2010)   LDL: 53 (03/29/2010)   TG: 99.0 (03/29/2010)  Problem # 5:  CORONARY ARTERY DISEASE (ICD-414.00) Assessment: Comment Only  no apparent angina  His updated medication list for this problem includes:    Altace 2.5 Mg Caps (Ramipril) .Marland Kitchen... Take 1 capsule by mouth once a day    Aspirin 81 Mg Tbec (Aspirin) .Marland Kitchen... Take 1 tablet by mouth once daily  Complete Medication List: 1)  Synthroid 100 Mcg Tabs (Levothyroxine sodium) .... Take 1 tablet by mouth once a day 2)  Altace 2.5 Mg Caps (Ramipril) .... Take 1 capsule by mouth once a day 3)  Hydrocodone-acetaminophen 5-325 Mg Tabs (Hydrocodone-acetaminophen) .Marland Kitchen.. 1  tab  three times a day as needed for severe  pain 4)  Pravastatin Sodium 40 Mg Tabs (Pravastatin sodium) .... Take 1 by mouth once daily 5)  Aspirin 81 Mg Tbec (Aspirin) .... Take 1 tablet by mouth once daily 6)  Vitamin C 500 Mg Tabs (Ascorbic acid) 7)  Omega-3 350 Mg Caps (Omega-3 fatty acids) .... Take 1 by mouth 4 times daily 8)  Acetyl L-carnitine 250 Mg Caps (Acetylcarnitine hcl) .... Take one by mouth once a day 9)  Pepcid Ac Maximum Strength 20 Mg Tabs (Famotidine) .... Take one by mouth two times a day 10)  Vitamin E 400 Unit Caps (Vitamin e) .... Take 1 by mouth once daily 11)  Aleve 220 Mg Tabs (Naproxen sodium) .... Take 1 by mouth two times a day  Patient Instructions: 1)  Please schedule a follow-up appointment in 4 months .   Current Allergies (reviewed today): ARICEPT (DONEPEZIL HCL)

## 2011-01-21 NOTE — Assessment & Plan Note (Signed)
Summary: PPD/DS   Nurse Visit   Allergies: 1)  Aricept (Donepezil Hcl)  Immunizations Administered:  PPD Skin Test:    Vaccine Type: PPD    Site: right forearm    Mfr: Sanofi Pasteur    Dose: 0.1 ml    Route: ID    Given by: Linde Gillis CMA (AAMA)    Exp. Date: 10/04/2011    Lot #: [id number]  PPD Results    Date of reading: 04/19/2010    Results: 0mm    Interpretation: negative  Orders Added: 1)  TB Skin Test [86580] 2)  Admin 1st Vaccine [45409]

## 2011-01-21 NOTE — Assessment & Plan Note (Signed)
Summary: TD PER DR Tylene Quashie   Nurse Visit   Allergies: 1)  Aricept (Donepezil Hcl)  Immunizations Administered:  DPT Vaccine # 1:    Vaccine Type: DT    Site: right deltoid    Mfr: Sanofi Pasteur    Dose: 0.5 ml    Route: IM    Given by: Lowella Petties CMA    Exp. Date: 01/23/2012    Lot #: [id number]    VIS given: 05/07/06 version given July 16, 2010.  Orders Added: 1)  DT Vaccine [32951] 2)  Admin 1st Vaccine [88416]

## 2011-01-21 NOTE — Letter (Signed)
Summary: Generic Letter  [place] [facility]  [address] [place], [place] [address]   Phone: [phone]  Fax: [phone]               10/16/2010  GILLIS BOARDLEY [address] [place], [place]  [address]  Dear Mr. FLENNER,  This letter is to serve as my medical clearance for your participation in the proposed research study "Effects of caninc interaction on participation in physical activity in persons with dementia".  I have been your primary care physician for many years and am well acquainted with your medical status--including your history of coronary heart disease. You are healthy enough to participate in the proposed study and I do not anticipate any significant problems could occur due to the proposed study.    Sincerely,      Tillman Abide MD

## 2011-01-21 NOTE — Progress Notes (Signed)
Summary: needs tetanus  Phone Note Call from Patient   Caller: Spouse Call For: Cindee Salt MD Action Taken: Patient advised to go to ER Summary of Call: Pt was bitten by a dog yesterday and needs a Td.  Last one in our records is from 57.  OK to give vaccine? Initial call taken by: Lowella Petties CMA,  July 16, 2010 10:57 AM  Follow-up for Phone Call        yes Please administer plain Td Follow-up by: Cindee Salt MD,  July 16, 2010 11:16 AM  Additional Follow-up for Phone Call Additional follow up Details #1::        Nurse visit appt made. Additional Follow-up by: Lowella Petties CMA,  July 16, 2010 12:09 PM

## 2011-01-21 NOTE — Progress Notes (Signed)
Summary: pt is confused  Phone Note Call from Patient   Caller: Edwin Mclean  [phone] Call For: Cindee Salt MD Summary of Call: Pt was admitted to memory care unit at [facility] a couple of days ago.  He is not sleeping, confused.  Wife states you are planning to see the pt at the faciltiy today and she would like to talk to you about this. Initial call taken by: Lowella Petties CMA, AAMA,  November 20, 2010 11:32 AM  Follow-up for Phone Call        message left Follow-up by: Cindee Salt MD,  November 20, 2010 1:57 PM  Additional Follow-up for Phone Call Additional follow up Details #1::        I spoke to wife there yesterday afternoon Additional Follow-up by: Cindee Salt MD,  November 21, 2010 12:58 PM

## 2011-01-21 NOTE — Assessment & Plan Note (Signed)
Summary: FOLLOW UP   Vital Signs:  Patient Profile:   77 Years Old Male Weight:      179.25 pounds Temp:     97.3 degrees F oral Pulse rate:   72 / minute BP sitting:   140 / 84  (left arm)  Vitals Entered By: Wandra Mannan (Apr 28, 2007 9:07 AM)               Chief Complaint:  fu.  History of Present Illness: Has been off pravachol about 2 months, he doesn't notice any difference. Wife feels at times he seems slightly better but not consistent. She doesn't notice any consistent improvement. Reviewed testing--has had all appropriate blood tests but never had MRI  Discussed aricept--did get bloating and GI symptoms, probably starting with 5mg  dose and more evident with the 10mg .  Neither he nor his wife saw much result at either dose.  Current Allergies: ARICEPT (DONEPEZIL HYDROCHLORIDE)     Review of Systems       No anxiety or depression     Impression & Recommendations:  Problem # 1:  COGNITIVE IMPAIRMENT, MILD, SO STATED 12/05, 09/07 (ICD-331.83) Assessment: Unchanged Probably has early Altzheimer's No response with aricept Discussed neurology eval--they want to hold off for now  P:Brain MRI   No meds now Orders: Radiology Referral (Radiology)   Problem # 2:  HYPERCHOLESTEROLEMIA (ICD-272.0)  His updated medication list for this problem includes:    Pravachol 40 Mg Tabs (Pravastatin sodium) .Marland Kitchen... Take 1 tablet by mouth at bedtime  he is been office protocol for a couple of months and neither he nor his wife noticed a major change in cognitive function.  We discussed whether he should be continuing on his statin, and this was not clear.  We will check his lipid profile now if his LDL is below 130, we should probably consider not restarting it.  It is below 100 ago for no one restart it.  It is above 130 he should probably consider it but I will discuss it with him at the next visit in about 3 months.    Patient Instructions: 1)  Please schedule a  follow-up appointment in 3 months. 2)  Stop at front to schedule MRI  Appended Document: Orders Update    Clinical Lists Changes  Orders: Added new Test order of TLB-Lipid Panel (80061-LIPID) - Signed Added new Test order of TLB-ALT (SGPT) (84460-ALT) - Signed

## 2011-01-21 NOTE — Letter (Signed)
Summary: Generic Letter  [place] [facility]  [address] [place], [place] [address]   Phone: [phone]  Fax: [phone]         07/01/2010  Raelene Bott and Lucina Mellow PA  Dear Mr. Mayford Knife,  I had the opportunity to interview my patient, Edwin Mclean (DOB  10/01/2027) today. He was unable to tell me what a will is (just said it was a transfer of money but couldn't connect it with a death) and couldn't independently discuss the plans he and his wife had discussed about donating their house to the city of [place].  I do not believe he has capacity sufficient to understand a change to his will.    Sincerely,     Tillman Abide MD

## 2011-01-21 NOTE — Assessment & Plan Note (Signed)
Summary: 4 MONTH FOLLOW UP/RBH   Vital Signs:  Patient profile:   77 year old male Weight:      166 pounds BMI:     26.09 Temp:     98.2 degrees F oral Pulse rate:   76 / minute Pulse rhythm:   regular BP sitting:   110 / 60  (left arm) Cuff size:   large  Vitals Entered By: Mervin Hack CMA Duncan Dull) (March 29, 2010 12:31 PM) CC: 4 month follow-up   History of Present Illness: Doing okay  changed to pravastatin as pravachol not available he hasn't noted any difference  No chest pain No SOB Still active outside---sweeping, etc he does have to stop due to fatigue but no major change No edema  He doesn't notice any difference in memory Still slipping per wife Long term memory now affected Mistakes about his own family, states he used to live in [place] (but hasn't) Not driving Still independent with ADLs but needs help choosing clothes occ puts on multiple shirts Apraxia for mechanical things--can't start blower anymore, etc Resists wife going out at night--wife leaves note and cellphone number when she has things to do out  Occ spells of acid reflux related to caffeine No recent flank pain episodes--does take aleve two times a day   Allergies: 1)  Aricept (Donepezil Hcl)  Past History:  Past medical, surgical, family and social histories (including risk factors) reviewed for relevance to current acute and chronic problems.  Past Medical History: Reviewed history from 02/23/2009 and no changes required. Coronary artery disease----------------------Dr Wall Hypertension Hypothyroidism Osteopenia 7/02 Altzheimers GERD  Past Surgical History: Reviewed history from 05/24/2008 and no changes required. Stress cardiolite normal EF 70% 1/04 Right carotid tumor- benign 1978 Nasal lesion 1980 Total prostatectomy 1994 Vasectomy 1979 CABG 10/98 Stress cardiolite neg. 2/07 DEXA 7/02 EGD with mild narrowing--Dr Marina Goodell  2006 Cardiolite normal 1/04 Cardiolite neg.  1/06 Overnight admit CP Stress myoview neg. EF 70% 10/06 Adenosine myoview- normal EF 64% 10/07 EGD/dilatation 5/09 Dr Marina Goodell  Family History: Reviewed history from 04/01/2007 and no changes required. Dad died of heart problems Mom had no sig health issues 1 sib with TB  Social History: Reviewed history from 04/01/2007 and no changes required. Retired--from [employer] Married-2nd      2 children from 1st marriage Never Smoked Alcohol use-yes--rare  Review of Systems       appetite okay weight down 4# though takes a long time to chew sleeps okay No sig anxiety or depression  Physical Exam  General:  alert and normal appearance.   Neck:  supple, no masses, no thyromegaly, no carotid bruits, and no cervical lymphadenopathy.   Lungs:  normal respiratory effort and normal breath sounds.   Heart:  normal rate, regular rhythm, no murmur, and no gallop.   Abdomen:  soft and non-tender.   Msk:  no joint tenderness and no joint swelling.   Extremities:  no edema Psych:  normally interactive, good eye contact, not anxious appearing, and not depressed appearing.     Impression & Recommendations:  Problem # 1:  ALZHEIMERS DISEASE (ICD-331.0) Assessment Deteriorated ongoing slow deterioration discussed [facility] Info given  Problem # 2:  CORONARY ARTERY DISEASE (ICD-414.00) Assessment: Unchanged  seems quiet no changes  His updated medication list for this problem includes:    Altace 2.5 Mg Caps (Ramipril) .Marland Kitchen... Take 1 capsule by mouth once a day    Aspirin 81 Mg Tbec (Aspirin) .Marland KitchenMarland KitchenMarland KitchenMarland Kitchen  Take 1 tablet by mouth once daily  Orders: TLB-Renal Function Panel (80069-RENAL) TLB-CBC Platelet - w/Differential (85025-CBCD) TLB-TSH (Thyroid Stimulating Hormone) (84443-TSH)  Problem # 3:  HYPERCHOLESTEROLEMIA (ICD-272.0) Assessment: Unchanged  okay with generic will check labs  His updated medication list for this problem includes:     Pravastatin Sodium 40 Mg Tabs (Pravastatin sodium) .Marland Kitchen... Take 1 by mouth once daily  Labs Reviewed: SGOT: 32 (06/27/2009)   SGPT: 21 (06/27/2009)   HDL:68.20 (06/27/2009), 58.7 (09/28/2008)  LDL:74 (06/27/2009), 73 (09/28/2008)  Chol:158 (06/27/2009), 156 (09/28/2008)  Trig:81.0 (06/27/2009), 120 (09/28/2008)  Orders: TLB-Lipid Panel (80061-LIPID) TLB-Hepatic/Liver Function Pnl (80076-HEPATIC) Venipuncture (91478)  Problem # 4:  GERD (ICD-530.81) Assessment: Unchanged queit on meds  His updated medication list for this problem includes:    Pepcid Ac Maximum Strength 20 Mg Tabs (Famotidine) .Marland Kitchen... Take one by mouth two times a day  Complete Medication List: 1)  Synthroid 100 Mcg Tabs (Levothyroxine sodium) .... Take 1 tablet by mouth once a day 2)  Altace 2.5 Mg Caps (Ramipril) .... Take 1 capsule by mouth once a day 3)  Aspirin 81 Mg Tbec (Aspirin) .... Take 1 tablet by mouth once daily 4)  Vitamin C 500 Mg Tabs (Ascorbic acid) 5)  Omega-3 350 Mg Caps (Omega-3 fatty acids) .... Take 1 by mouth 4 times daily 6)  Acetyl L-carnitine 250 Mg Caps (Acetylcarnitine hcl) .... Take one by mouth once a day 7)  Pepcid Ac Maximum Strength 20 Mg Tabs (Famotidine) .... Take one by mouth two times a day 8)  Vitamin E 400 Unit Caps (Vitamin e) .... Take 1 by mouth once daily 9)  Hydrocodone-acetaminophen 5-325 Mg Tabs (Hydrocodone-acetaminophen) .Marland Kitchen.. 1-2 tabs three times a day as needed for severe abdominal or side pain 10)  Aleve 220 Mg Tabs (Naproxen sodium) .... Take 1 by mouth two times a day 11)  Pravastatin Sodium 40 Mg Tabs (Pravastatin sodium) .... Take 1 by mouth once daily  Other Orders: TLB-T4 (Thyrox), Free [phone]) TLB-PSA (Prostate Specific Antigen) (84153-PSA)  Patient Instructions: 1)  Please schedule a follow-up appointment in 4 months .   Current Allergies (reviewed today): ARICEPT (DONEPEZIL HCL)

## 2011-01-21 NOTE — Progress Notes (Signed)
Summary: daycare  Phone Note Call from Patient Call back at Home Phone [phone] Call back at [phone]   Caller: Patient Call For: Cindee Salt MD Summary of Call: Patient is going to start Day care at the [facility] and wife called says that he needs a cpx to start. She wants to know if he will need to do the cpx because he was just seen. She wants to know if he can just have the form filled out. Please advise.  Initial call taken by: Melody Comas,  April 03, 2010 4:24 PM  Follow-up for Phone Call        Wife will be bringing foms in.  Follow-up by: Melody Comas,  April 03, 2010 5:06 PM  Additional Follow-up for Phone Call Additional follow up Details #1::        I can just do the forms She doesn't need to bring him in again There will be $20 charge to do the form though Cindee Salt MD  April 04, 2010 2:16 PM   wife mailed forms today, pt will need a ppd, I advised wife to schedule nurse visit. Additional Follow-up by: Mervin Hack CMA Duncan Dull),  April 04, 2010 4:39 PM

## 2011-01-21 NOTE — Miscellaneous (Signed)
Summary: DO NOT RESUSCITATE ORDER  DO NOT RESUSCITATE ORDER   Imported By: Carin Primrose 04/18/2010 08:13:54  _____________________________________________________________________  External Attachment:    Type:   Image     Comment:   External Document

## 2011-01-21 NOTE — Assessment & Plan Note (Signed)
Summary: BACK PAIN/CLE   Vital Signs:  Patient profile:   77 year old male Weight:      164 pounds Temp:     97.7 degrees F oral Pulse rate:   80 / minute Pulse rhythm:   regular BP sitting:   148 / 80  (left arm) Cuff size:   large  Vitals Entered By: Mervin Hack CMA Duncan Dull) (May 02, 2010 2:38 PM) CC: back pain   History of Present Illness: Having some increased trouble with back Had flank issues for some time but this seems different  Pain on left side and in middle of low thoracic back no rash No palpbable abnormality Wife giving 2 aleve in AM and this seems to be helping  Now going to [facility]-- 2 days per week, next week starts 3 days per week  No leg weakness No loss of bladder or bowel control   Allergies: 1)  Aricept (Donepezil Hcl)  Past History:  Past medical, surgical, family and social histories (including risk factors) reviewed for relevance to current acute and chronic problems.  Past Medical History: Reviewed history from 02/23/2009 and no changes required. Coronary artery disease----------------------Dr Wall Hypertension Hypothyroidism Osteopenia 7/02 Altzheimers GERD  Past Surgical History: Reviewed history from 05/24/2008 and no changes required. Stress cardiolite normal EF 70% 1/04 Right carotid tumor- benign 1978 Nasal lesion 1980 Total prostatectomy 1994 Vasectomy 1979 CABG 10/98 Stress cardiolite neg. 2/07 DEXA 7/02 EGD with mild narrowing--Dr Marina Goodell  2006 Cardiolite normal 1/04 Cardiolite neg. 1/06 Overnight admit CP Stress myoview neg. EF 70% 10/06 Adenosine myoview- normal EF 64% 10/07 EGD/dilatation 5/09 Dr Marina Goodell  Family History: Reviewed history from 04/01/2007 and no changes required. Dad died of heart problems Mom had no sig health issues 1 sib with TB  Social History: Reviewed history from 04/01/2007 and no changes required. Retired--from [employer] Married-2nd      2 children from 1st marriage Never  Smoked Alcohol use-yes--rare  Review of Systems       No history of kidney stones no hematuria  Physical Exam  General:  alert and healthy-appearing.   Msk:  low thoracic kyphosis no sig tenderness in back or spine normal flexion Neurologic:  strength normal in all extremities and gait normal.     Impression & Recommendations:  Problem # 1:  BACK PAIN (ICD-724.5) Assessment New seems to be just a muscle sprain responds to aleve discussed heat also  His updated medication list for this problem includes:    Aspirin 81 Mg Tbec (Aspirin) .Marland Kitchen... Take 1 tablet by mouth once daily    Hydrocodone-acetaminophen 5-325 Mg Tabs (Hydrocodone-acetaminophen) .Marland Kitchen... 1  tab  three times a day as needed for severe  pain    Aleve 220 Mg Tabs (Naproxen sodium) .Marland Kitchen... Take 1 by mouth two times a day  Complete Medication List: 1)  Synthroid 100 Mcg Tabs (Levothyroxine sodium) .... Take 1 tablet by mouth once a day 2)  Altace 2.5 Mg Caps (Ramipril) .... Take 1 capsule by mouth once a day 3)  Aspirin 81 Mg Tbec (Aspirin) .... Take 1 tablet by mouth once daily 4)  Vitamin C 500 Mg Tabs (Ascorbic acid) 5)  Omega-3 350 Mg Caps (Omega-3 fatty acids) .... Take 1 by mouth 4 times daily 6)  Acetyl L-carnitine 250 Mg Caps (Acetylcarnitine hcl) .... Take one by mouth once a day 7)  Pepcid Ac Maximum Strength 20 Mg Tabs (Famotidine) .... Take one by mouth two times a day 8)  Vitamin E  400 Unit Caps (Vitamin e) .... Take 1 by mouth once daily 9)  Hydrocodone-acetaminophen 5-325 Mg Tabs (Hydrocodone-acetaminophen) .Marland Kitchen.. 1  tab  three times a day as needed for severe  pain 10)  Aleve 220 Mg Tabs (Naproxen sodium) .... Take 1 by mouth two times a day 11)  Pravastatin Sodium 40 Mg Tabs (Pravastatin sodium) .... Take 1 by mouth once daily  Patient Instructions: 1)  Please keep regular follow up Prescriptions: HYDROCODONE-ACETAMINOPHEN 5-325 MG TABS (HYDROCODONE-ACETAMINOPHEN) 1  tab  three times a day as needed  for severe  pain  #30 x 0   Entered and Authorized by:   Cindee Salt MD   Signed by:   Cindee Salt MD on 05/02/2010   Method used:   Print then Give to Patient   RxID:   [id number]   Current Allergies (reviewed today): ARICEPT (DONEPEZIL HCL)

## 2011-01-21 NOTE — Assessment & Plan Note (Signed)
Summary: 4 M F/U /DLO   Vital Signs:  Patient profile:   77 year old male Weight:      165 pounds Temp:     98.0 degrees F oral Pulse rate:   60 / minute Pulse rhythm:   regular BP sitting:   108 / 60  (left arm) Cuff size:   large  Vitals Entered By: Mervin Hack CMA Duncan Dull) (November 08, 2010 10:18 AM) CC: 4 month follow-up   History of Present Illness: Here with wife as usual  Please see scanned note from wife He has markedly deteriorated Much more confused Now wanders around house and rearranges things needs care with ADLs Agitated at times [facility]  Still occ has the left flank pain--esp after raking leaves  No chest pain breathing okay seems to still have good exercise tolerance  REstless, paces a lot occ down times but no persistent depressed mood   Allergies: 1)  Aricept (Donepezil Hcl)  Past History:  Past medical, surgical, family and social histories (including risk factors) reviewed for relevance to current acute and chronic problems.  Past Medical History: Reviewed history from 02/23/2009 and no changes required. Coronary artery disease----------------------Dr Wall Hypertension Hypothyroidism Osteopenia 7/02 Altzheimers GERD  Past Surgical History: Reviewed history from 05/24/2008 and no changes required. Stress cardiolite normal EF 70% 1/04 Right carotid tumor- benign 1978 Nasal lesion 1980 Total prostatectomy 1994 Vasectomy 1979 CABG 10/98 Stress cardiolite neg. 2/07 DEXA 7/02 EGD with mild narrowing--Dr Marina Goodell  2006 Cardiolite normal 1/04 Cardiolite neg. 1/06 Overnight admit CP Stress myoview neg. EF 70% 10/06 Adenosine myoview- normal EF 64% 10/07 EGD/dilatation 5/09 Dr Marina Goodell  Family History: Reviewed history from 04/01/2007 and no changes required. Dad died of heart problems Mom had no sig health issues 1 sib with TB  Social History: Reviewed history from 04/01/2007 and no  changes required. Retired--from [employer] Married-2nd      2 children from 1st marriage Never Smoked Alcohol use-yes--rare  Review of Systems       appetite is slightly down weight is actually up a few pounds--may be from shoes, etc sleeps okay most nights--occ up and rummaging around  Physical Exam  General:  alert.  NAD Neck:  supple, no masses, no thyromegaly, no carotid bruits, and no cervical lymphadenopathy.   Lungs:  normal respiratory effort, no intercostal retractions, no accessory muscle use, and normal breath sounds.   Heart:  normal rate, regular rhythm, no murmur, and no gallop.   Abdomen:  soft and non-tender.   Pulses:  1+ in feet Extremities:  no edema Neurologic:  clear cognitive worsening passive now and less interaction Even answering questions, he has trouble getting words out that make sense Psych:  not anxious appearing, not depressed appearing, withdrawn, and poor eye contact.     Impression & Recommendations:  Problem # 1:  ALZHEIMERS DISEASE (ICD-331.0) Assessment Deteriorated sig decline will be admitted at [facility] on 11/28 I will follow him there  Problem # 2:  CORONARY ARTERY DISEASE (ICD-414.00) Assessment: Unchanged seems to be quiet no changes  His updated medication list for this problem includes:    Altace 2.5 Mg Caps (Ramipril) .Marland Kitchen... Take 1 capsule by mouth once a day    Aspirin 81 Mg Tbec (Aspirin) .Marland Kitchen... Take 1 tablet by mouth once daily  Problem # 3:  HYPERTENSION (ICD-401.9) Assessment: Unchanged  BP fine will continue for now due to CAD  His updated medication list for this  problem includes:    Altace 2.5 Mg Caps (Ramipril) .Marland Kitchen... Take 1 capsule by mouth once a day  BP today: 108/60 Prior BP: 148/80 (07/01/2010)  Labs Reviewed: K+: 4.4 (03/29/2010) Creat: : 0.9 (03/29/2010)   Chol: 134 (03/29/2010)   HDL: 61.20 (03/29/2010)   LDL: 53 (03/29/2010)   TG: 99.0 (03/29/2010)  Orders: TLB-Renal Function Panel  (80069-RENAL) TLB-CBC Platelet - w/Differential (85025-CBCD) Venipuncture (16109)  Problem # 4:  HYPERCHOLESTEROLEMIA (ICD-272.0) Assessment: Unchanged  no problems will recheck labs  His updated medication list for this problem includes:    Pravastatin Sodium 40 Mg Tabs (Pravastatin sodium) .Marland Kitchen... Take 1 by mouth once daily  Labs Reviewed: SGOT: 21 (03/29/2010)   SGPT: 14 (03/29/2010)   HDL:61.20 (03/29/2010), 68.20 (06/27/2009)  LDL:53 (03/29/2010), 74 (06/27/2009)  Chol:134 (03/29/2010), 158 (06/27/2009)  Trig:99.0 (03/29/2010), 81.0 (06/27/2009)  Orders: TLB-Lipid Panel (80061-LIPID) TLB-Hepatic/Liver Function Pnl (80076-HEPATIC)  Complete Medication List: 1)  Synthroid 100 Mcg Tabs (Levothyroxine sodium) .... Take 1 tablet by mouth once a day 2)  Altace 2.5 Mg Caps (Ramipril) .... Take 1 capsule by mouth once a day 3)  Hydrocodone-acetaminophen 5-325 Mg Tabs (Hydrocodone-acetaminophen) .Marland Kitchen.. 1  tab  three times a day as needed for severe  pain 4)  Pravastatin Sodium 40 Mg Tabs (Pravastatin sodium) .... Take 1 by mouth once daily 5)  Aspirin 81 Mg Tbec (Aspirin) .... Take 1 tablet by mouth once daily 6)  Vitamin C 500 Mg Tabs (Ascorbic acid) 7)  Omega-3 350 Mg Caps (Omega-3 fatty acids) .... Take 1 by mouth 4 times daily 8)  Acetyl L-carnitine 250 Mg Caps (Acetylcarnitine hcl) .... Take 2 by mouth once a day 9)  Pepcid Ac Maximum Strength 20 Mg Tabs (Famotidine) .... Take one by mouth two times a day 10)  Vitamin E 400 Unit Caps (Vitamin e) .... Take 1 by mouth two times a day 11)  Aleve 220 Mg Tabs (Naproxen sodium) .... Take 1 by mouth two times a day  Other Orders: TLB-TSH (Thyroid Stimulating Hormone) (84443-TSH) TLB-T4 (Thyrox), Free [phone])  Patient Instructions: 1)  Wil [facility] at [facility] from now on   Orders Added: 1)  TLB-Lipid Panel [80061-LIPID] 2)  TLB-Hepatic/Liver Function Pnl [80076-HEPATIC] 3)  TLB-TSH (Thyroid Stimulating Hormone) [84443-TSH] 4)   TLB-T4 (Thyrox), Free [19147-WG9F] 5)  TLB-Renal Function Panel [80069-RENAL] 6)  TLB-CBC Platelet - w/Differential [85025-CBCD] 7)  Venipuncture [36415] 8)  Est. Patient Level IV [62130]    Current Allergies (reviewed today): ARICEPT (DONEPEZIL HCL)  Appended Document: 4 M F/U /DLO     Clinical Lists Changes  Orders: Added new Service order of Flu Vaccine 61yrs + MEDICARE PATIENTS (Q6578) - Signed Added new Service order of Administration Flu vaccine - MCR (I6962) - Signed Observations: Added new observation of FLU VAX VIS: 07/16/2010 version (11/08/2010 13:02) Added new observation of FLU VAXLOT: [id number] (11/08/2010 13:02) Added new observation of FLU VAXMFR: Glaxosmithkline (11/08/2010 13:02) Added new observation of FLU VAX EXP: 06/21/2011 (11/08/2010 13:02) Added new observation of FLU VAX DSE: 0.42ml (11/08/2010 13:02) Added new observation of FLU VAX: Fluvax 3+ (11/08/2010 13:02) Flu Vaccine Consent Questions     Do you have a history of severe allergic reactions to this vaccine? no    Any prior history of allergic reactions to egg and/or gelatin? no    Do you have a sensitivity to the preservative Thimersol? no    Do you have a past history of Guillan-Barre Syndrome? no    Do you currently have an  acute febrile illness? no    Have you ever had a severe reaction to latex? no    Vaccine information given and explained to patient? yes    Are you currently pregnant? no    Lot Number:[id number]   Exp Date:06/21/2011   Site Given  Left Deltoid IM   .[email or web address]

## 2011-01-21 NOTE — Miscellaneous (Signed)
Summary: Health Condition & Medical Info [facility] Condition & Medical Info Forms/[place]   Imported By: Lanelle Bal 05/09/2010 10:02:43  _____________________________________________________________________  External Attachment:    Type:   Image     Comment:   External Document

## 2011-01-21 NOTE — Progress Notes (Signed)
Summary: Pravachol on backorder  Phone Note From Pharmacy Call back at [phone]   Caller: [facility] Call For: Dr. Patsy Lager  Summary of Call: Received faxed form stating that Pravachol 40mg  is back ordered, wants to [place] Pravastatin 40mg  instead.  Form in your IN box, please advise Initial call taken by: Linde Gillis CMA Duncan Dull),  January 16, 2010 2:15 PM  Follow-up for Phone Call        okay to change to pravastatin Follow-up by: Cindee Salt MD,  January 16, 2010 2:48 PM  Additional Follow-up for Phone Call Additional follow up Details #1::        changed and rx sent to [facility] Additional Follow-up by: DeShannon Katrinka Blazing CMA Duncan Dull),  January 16, 2010 3:45 PM    New/Updated Medications: PRAVASTATIN SODIUM 40 MG TABS (PRAVASTATIN SODIUM) take 1 by mouth once daily Prescriptions: PRAVASTATIN SODIUM 40 MG TABS (PRAVASTATIN SODIUM) take 1 by mouth once daily  #90 x 3   Entered by:   Mervin Hack CMA (AAMA)   Authorized by:   Cindee Salt MD   Signed by:   Mervin Hack CMA (AAMA) on 01/16/2010   Method used:   Faxed to ...       [facility]       [address] [place], [place]  [address]       Ph: [phone]       Fax: [phone]   RxID:   [id number]

## 2011-01-21 NOTE — Letter (Signed)
Summary: Request for Note for Participation in Research [employer]  Request for Note for Participation in Research Study/[facility] [place]   Imported By: Lanelle Bal 10/23/2010 08:38:55  _____________________________________________________________________  External Attachment:    Type:   Image     Comment:   External Document

## 2011-01-21 NOTE — Progress Notes (Signed)
Summary: PPD/DNR  Phone Note Call from Patient Call back at Home Phone [phone]   Caller: Spouse Call For: Cindee Salt MD Summary of Call: pt coming in tomorrow for PPD and also would like DNR filled out. Initial call taken by: Mervin Hack CMA Duncan Dull),  April 16, 2010 3:46 PM  Follow-up for Phone Call        okay Follow-up by: Cindee Salt MD,  April 16, 2010 6:58 PM  Additional Follow-up for Phone Call Additional follow up Details #1::        DNR form done Needs address stamped and then scanned Cindee Salt MD  April 17, 2010 7:53 AM   stamped and scanned. Additional Follow-up by: Mervin Hack CMA Duncan Dull),  April 17, 2010 9:45 AM

## 2011-01-21 NOTE — Letter (Signed)
Summary: Letter from Kandis Nab to [facility]  Letter from Kandis Nab to [facility]   Imported By: Beau Fanny 11/08/2010 14:44:54  _____________________________________________________________________  External Attachment:    Type:   Image     Comment:   External Document

## 2011-01-27 DIAGNOSIS — G479 Sleep disorder, unspecified: Secondary | ICD-10-CM

## 2011-03-06 DIAGNOSIS — F0280 Dementia in other diseases classified elsewhere without behavioral disturbance: Secondary | ICD-10-CM

## 2011-03-06 DIAGNOSIS — K219 Gastro-esophageal reflux disease without esophagitis: Secondary | ICD-10-CM

## 2011-03-06 DIAGNOSIS — G309 Alzheimer's disease, unspecified: Secondary | ICD-10-CM

## 2011-03-06 DIAGNOSIS — I2510 Atherosclerotic heart disease of native coronary artery without angina pectoris: Secondary | ICD-10-CM

## 2011-03-06 DIAGNOSIS — G479 Sleep disorder, unspecified: Secondary | ICD-10-CM

## 2011-03-12 DIAGNOSIS — K5900 Constipation, unspecified: Secondary | ICD-10-CM

## 2011-03-31 DIAGNOSIS — N342 Other urethritis: Secondary | ICD-10-CM

## 2011-04-07 DIAGNOSIS — G479 Sleep disorder, unspecified: Secondary | ICD-10-CM

## 2011-04-08 ENCOUNTER — Telehealth: Payer: Self-pay | Admitting: *Deleted

## 2011-04-08 NOTE — Telephone Encounter (Addendum)
Pt's wife is asking for a letter stating that pt is unable to take care of his own financial matters.  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  She needs this for herself, really,  because the pt is in line to be his wife's POA, which he is no longer capable of being.  This letter will need to be signed by you and one other doctor here.  Please call wife when ready.  Letter written $20 charge applies

## 2011-04-10 DIAGNOSIS — Z0279 Encounter for issue of other medical certificate: Secondary | ICD-10-CM

## 2011-04-10 NOTE — Telephone Encounter (Signed)
I left a message for pt's wife to pick up letter and I let her know there will be a $20 charge.

## 2011-05-06 NOTE — Assessment & Plan Note (Signed)
[facility]                         GASTROENTEROLOGY OFFICE NOTE   Edwin Mclean, Edwin Mclean                       MRN:          [id number]  DATE:04/25/2008                            DOB:          1927-08-17    REFERRING PHYSICIAN:  Karie Schwalbe, MD   REASON FOR EVALUATION:  Abdominal pain.   HISTORY:  This is a 77 year old white male with a history of prostate  cancer, coronary artery disease, status post coronary artery bypass  grafting, hyperlipidemia, hypothyroidism, and gastroesophageal reflux  disease with peptic stricture.  The patient presents today with  different complaints.  His wife is here.  She provides a lot of history.  She states that he was in his usual state of health until January of  this year.  He began to describe a cold feeling.  This was accompanied  by shiver and some nausea.  Patient states thereafter that he would feel  awful.  This would last for a few minutes.  Subsequently, he would belch  6-8 times and say that he was improved except for persisting fatigue.  At one point, he went to the [facility] Emergency Room to make sure his  problems were not cardiac.  Apparently, his evaluation was negative,  including an abdominal ultrasound.  The folks there told him that it may  be GI in nature.  He was tried on Pepcid b.i.d., which seemed to work  for a while, subsequently changed to Prilosec.  Recurrent symptoms.  Subsequently developed some tenderness in the right upper quadrant to  touch.  His wife treated him with bicarbonate as well as elevation of  the head of the bed.  They think this helped.  He has had no vomiting,  weight loss, change in bowel habits, melena, or hematochezia.  They deny  prior similar symptoms.  However, in July, 2006, he was evaluated here  with similar-sounding right upper quadrant pain, for which he underwent  an extensive workup.  This was negative.  He was treated for  Helicobacter pylori.   Upper endoscopy revealed a peptic stricture and a  hiatal hernia.  Rapid urease testing was negative.  He was placed on  omeprazole.  Abdominal ultrasound was negative.  His pain was really not  felt to be gastrointestinal in nature and apparently abated.  He also  underwent a colonoscopy in 2002.  This was normal except for diminutive  polyp and some ileal inflammation.  Patient has been having some mild  intermittent dysphagia prior to the development of the symptoms.   PAST MEDICAL HISTORY:  As above.  In addition, mild cognitive  impairment.   PAST SURGICAL HISTORY:  1. Right carotid tumor removal (benign).  2. Prostatectomy.  3. Vasectomy.  4. Coronary artery bypass grafting.   SOCIAL HISTORY:  Second marriage.  Two children from the first marriage.  Never smoked.  Does use alcohol.  Retired from [employer].   FAMILY HISTORY:  No family history of gastrointestinal malignancy.   ALLERGIES:  No known drug allergies.   CURRENT MEDICATIONS:  Synthroid, Pravachol, Altace, Coenzyme Q10,  multivitamin, magnesium, vitamin  E, baby aspirin, selenium, calcium,  vitamin B, Lutein, bilberry extract, and omega 3.   PHYSICAL EXAMINATION:  A well-appearing male in no acute distress.  He  is alert and oriented.  Blood pressure is 120/76, heart rate is 80 and regular, respirations are  16 and unlabored.  Weight is 177.2 pounds.  HEENT:  Sclerae are anicteric.  Conjunctivae are pink.  Oral mucosa is  intact.  Posterior pharynx is normal.  There is no adenopathy.  Thyroid is normal.  LUNGS:  Clear to auscultation and percussion.  HEART:  Regular without murmur.  ABDOMEN:  Soft, nontender, nondistended with good bowel sounds.  No  organomegaly, mass, or hernia.  Some complaints of tenderness to  palpation on the rib cage.  A small palpable lipoma.  RECTAL:  Omitted.  EXTREMITIES:  Without clubbing, cyanosis or edema.  NEUROLOGIC:  Grossly intact.  Alert and oriented x4.    IMPRESSION:  1. Patient really presents with two different symptom complexes.      First, very vague symptoms of feeling cold with nausea, belching,      and fatigue.  It really does not seem like a primary      gastrointestinal disorder.  A questionable response to acid-      suppression medications.  2. History of reflux disease and peptic stricture, now with some mild      intermittent dysphagia.  3. Right-sided pain:  Similar to pain in the past.  Most likely      musculoskeletal.  Certainly it does not appear to be primary      gastrointestinal. A component of anxiety may exist.  4. Multiple general medical problems.   PLAN:  1. Continue acid-suppressive medicine.  2. Schedule upper endoscopy with possible esophageal dilation.  The      nature of the procedure as well as the risks, benefits and      alternatives have been reviewed.  He understood and agreed to      proceed.  3. Reassurance regarding right-sided pain.  4. Ongoing general medical care with Dr. Alphonsus Sias.     Wilhemina Bonito. Edwin Goodell, MD  Electronically Signed    JNP/MedQ  DD: 04/25/2008  DT: 04/25/2008  Job #: [id number]   cc:   Karie Schwalbe, MD

## 2011-05-08 DIAGNOSIS — F0280 Dementia in other diseases classified elsewhere without behavioral disturbance: Secondary | ICD-10-CM

## 2011-05-08 DIAGNOSIS — G479 Sleep disorder, unspecified: Secondary | ICD-10-CM

## 2011-05-08 DIAGNOSIS — K219 Gastro-esophageal reflux disease without esophagitis: Secondary | ICD-10-CM

## 2011-05-08 DIAGNOSIS — I2510 Atherosclerotic heart disease of native coronary artery without angina pectoris: Secondary | ICD-10-CM

## 2011-05-08 DIAGNOSIS — G309 Alzheimer's disease, unspecified: Secondary | ICD-10-CM

## 2011-05-09 NOTE — Discharge Summary (Signed)
[place]  Patient:    Edwin Mclean, Edwin Mclean                         MRN: [id number] Attending:  Gerrit Friends. Dietrich Pates, M.D. [facility] Dictator:   Abelino Derrick, P.A.C. [facility] CC:         Thomas C. Wall, M.D. [facility]                           Discharge Summary  DISCHARGE DIAGNOSES: 1. Chest pain, myocardial infarction ruled out. 2. Coronary disease, status post coronary artery bypass grafting two years ago. 3. Hypertension. 4. Hyperlipidemia. 5. History of prostate cancer, status post surgery in 1993. 6. Degenerative joint disease. 7. Treated hypothyroidism.  HOSPITAL COURSE:  The patient is a 77 year old male who had bypass surgery two years ago.  The details are not available at the time of this dictation.  He presented on February ______, 2001 with midsternal chest pain while eating dinner. Initially, he thought that something had gotten stuck in his throat.  He had no  associated shortness of breath, diaphoresis, or nausea.  The symptoms resolved spontaneously after about 15 minutes.  They were similar to his prebypass symptoms. He presented to [facility] Emergency Room.  His initial CK and troponin were negative.  He was transferred to [place] for further evaluation.  His EKG showed no acute changes.  The patient was admitted to telemetry, followed CK-MB, and troponins were obtained.  These were negative.  His EKG on January 27, 2000 shows sinus rhythm with incomplete right bundle branch block, which is no change. Dr. Eden Emms feels he can be discharged and have a Cardiolite in the office later this week.  LABORATORY DATA:  BUN 13, creatinine 1.1, sodium 141, potassium 4.3.  CK at [place] was 285 with an MB of 3.8.  White count 7.3, hemoglobin 14.5, hematocrit 42.7, platelet count 241.  Troponin was 0.1.  CK and troponin here were negative.  EKG shows normal sinus rhythm, incomplete right bundle branch block.  DISCHARGE MEDICATIONS: 1. Pravachol 40 mg once a  day. 2. Synthroid 0.125 mg a day. 3. Prinivil 5 mg a day. 4. Aspirin q.d. 5. Nitroglycerin sublingual p.r.n.  DISPOSITION:  The patient has been set up for a rest stress Cardiolite Wednesday, January 29, 2000, [place] 9[place]  He has been given a prescription for nitroglycerin. DD:  01/27/00 TD:  01/27/00 Job: [id number]

## 2011-05-09 NOTE — Assessment & Plan Note (Signed)
[facility]                              CARDIOLOGY OFFICE NOTE   Edwin Mclean, Edwin Mclean                       MRN:          [id number]  DATE:10/23/2006                            DOB:          [birth year]    REFERRING PHYSICIAN:  Jesse Sans. Wall, MD, [facility]   Edwin Mclean comes in today for further management of his coronary artery  disease.  He is status post coronary artery bypass grafting x4 in 1998 with  a mammary to the LAD, vein graft to a diagonal, vein graft to a diagonal II,  vein graft to a right coronary artery.  He has multiple risk factors for  cardiovascular disease, and these are being treated and managed very  effectively by Dr. Tillman Abide.  He has had some recent memory loss and  is currently on some Aricept.   He is asymptomatic at present from a cardiovascular standpoint.  He had a  stress Adenosine Myoview on October 19, 2006, EF 64% with no significant ST  segment changes, no ischemia, and normal wall motion.   His meds are listed on the maintenance medication list and are virtually  unchanged.   PHYSICAL EXAMINATION:  VITAL SIGNS:  His blood pressure today was 156/84.  His pulse 74 and regular.  He weighs 179.  NECK:  His carotids are full.  There are no bruits.  There is no JVD.  Thyroid is not enlarged.  Trachea is midline.  LUNGS:  Clear.  HEART:  Soft S1 and S2.  ABDOMEN:  Soft with good bowel sounds.  There is no midline bruit.  EXTREMITIES:  Good pulses bilaterally, both posterior tibial and dorsalis  pedis.   Edwin Mclean is doing well.  He has a negative stress Myoview.  He is  asymptomatic.  He is on excellent secondary preventative therapy.  We will  plan on seeing him back in a year. With his grafts being 74 years old at  that time, we will perform annual stress Myoviews.  He prefers an exercise  treadmill as opposed to Adenosine.     Thomas C. Daleen Squibb, MD, [facility]  Electronically Signed    TCW/MedQ  DD: 10/23/2006   DT: 10/24/2006  Job #: [id number]   cc:   Karie Schwalbe, MD

## 2011-05-09 NOTE — Discharge Summary (Signed)
NAMEMIA, WINTHROP                ACCOUNT NO.:  [id number]   MEDICAL RECORD NO.:  [id number]          PATIENT TYPE:  OBV   LOCATION:  6529                         FACILITY:  [facility]   PHYSICIAN:  Rollene Rotunda, M.D.   DATE OF BIRTH:  Feb 24, 1927   DATE OF ADMISSION:  10/06/2005  DATE OF DISCHARGE:  10/07/2005                           DISCHARGE SUMMARY - REFERRING   DISCHARGE DIAGNOSES:  1.  Atypical prolonged chest discomfort.  2.  History per past medical history.   SUMMARY OF HISTORY:  Mr. Sansoucie is a 77 year old white male who was referred  to [facility] Emergency Room from the office secondary  to chest discomfort.  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  While he was underneath a countertop for installation, he  developed anterior chest pressure unassociated with any symptoms.  He also  felt very gloomy and blah, an attitude which he had never had before.  He  did stop work and rested and it decreased from a 5 to 1 to 2.  However, it  remained present until the following day, at which some point it gradually  eased off.  Since last Thursday, he has had two or three episodes that have  lasted a few seconds and he has also noticed some dyspnea on exertion since  the occurrence.  None of these symptoms resemble his symptoms prior to  bypass surgery.   PAST MEDICAL HISTORY:  1.  Hypertension.  2.  Hyperlipidemia, unknown last lipid check.  3.  Prostate cancer with surgery in 1993.  4.  DJD.  5.  Hypothyroidism. Unknown last TSH check.  6.  Hiatal hernia.  7.  GERD.  Recent endoscopy that was reportedly negative this past summer      secondary to abdominal discomfort.  8.  Three vessel coronary artery disease with last catheterization October 13, 1997, which resulted in four vessel bypass surgery with LIMA to LAD,      saphenous vein graft to diagonal, saphenous vein graft to diagonal II,      saphenous vein graft to  RCA.  LV function was reported to be normal.      Last stress Myoview on January 07, 2005, showed EF 60%, no ischemia.   LABORATORY DATA:  Admission H&H was 11.7 and 36.0, normal indices, platelets  325, wbc 6.7.  PT 13.0, PTT 28.  D-dimer 0.25.  Sodium 136, potassium 3.8,  BUN 10, creatinine 0.9, glucose 92, normal LFTs.  CK-MBs and troponins were  negative x3.  TSH 0.972.   Chest x-ray showed cardiomegaly without active disease.   EKG showed normal sinus rhythm, borderline left axis deviation, incomplete  right bundle branch block, first degree AV block which were chronic in  etiology.   HOSPITAL COURSE:  Mr. Yankee was admitted to 6500.  He did not know his  medications prior to his admission.  His wife was asked to bring these in.  These were not brought in.  We did obtain a list from the office from July  2006.  He was continued on his home medications.  Overnight he did not have  any further symptoms.  Enzymes and EKGs were negative for myocardial  infarction.  After review by Dr. Dietrich Pates, it was felt that the patient  could be discharged home with outpatient stress Myoview and follow up with  Dr. Daleen Squibb.   DISPOSITION:  Patient is discharged home, asked to maintain low salt, fat  and cholesterol diet.  He was asked to bring all medications to all  appointments.  He was asked to continue all home medications and we have  given him a prescription for nitroglycerin 0.4 as needed.  He will have a  stress Myoview on October 09, 2005, at 11:45, at Dr. Vern Claude office.  He will  then be followed up with Dr. Vern Claude physician assistant, Joellyn Rued, P.A.,  on October 29, 2005, at 11 a.m. on the day Dr. Daleen Squibb is in the office.  At  that time of that follow-up appointment, stress Myoview will also be  reviewed and lipids that were drawn in the hospital that are still pending  at the time of this dictation.      Joellyn Rued, P.A. [facility]    ______________________________  Rollene Rotunda, M.D.    EW/MEDQ  D:  10/07/2005  T:  10/07/2005  Job:  [id number]   cc:   Dalzell Bing, M.D. [facility]  [address]  [address]  [place]  [place] [address]   Jesse Sans. Wall, M.D.  [address]  [address]  [place]  [place] [address]   Dr. Orlie Dakin

## 2011-05-09 NOTE — H&P (Signed)
Edwin Mclean, Edwin Mclean NO.:  [id number]   MEDICAL RECORD NO.:  [id number]          PATIENT TYPE:  OBV   LOCATION:  1848                         FACILITY:  [facility]   PHYSICIAN:  Edwin Mclean, M.D.   DATE OF BIRTH:  1927/03/25   DATE OF ADMISSION:  10/06/2005  DATE OF DISCHARGE:                                HISTORY & PHYSICAL   PRIMARY CARE PHYSICIAN:  Dr. Alphonsus Mclean.   PRIMARY CARDIOLOGIST:  Edwin Mclean, M.D.   BRIEF HISTORY:  Edwin Mclean a 77 year old white male who was referred from the  office to [facility], Emergency Room secondary to chest discomfort.   Last week Edwin Mclean was in [place], [place], working a mission in. 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 He developed anterior chest pressure sensation. This did not  radiate nor was it associated with shortness of breath, nausea, vomiting or  diaphoresis. He states that he felt blaugh and a type of gloom or doom  attitude which he had never felt before. He stopped his work and went and  sat outside. It was a 5 on a scale of zero to 10. He did not return to work,  and his discomfort gradually eased by sometime Thursday. 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 He is not sure of the specific duration of the  discomfort, only that it did abate sometime Thursday. He drove back to  [place] on Friday and Saturday. During the drive back, he did experience  an episode on Friday that just lasted a few seconds, and again on Sunday  after his return, he experienced two episode that lasted seconds. He has  noticed since the onset of this discomfort that he has described as seen on  exertion since Wednesday which was relieved with rest. His symptoms do not  resemble his symptoms prior to his bypass surgery. He also notes that he  has  recently had a tetanus and a hepatitis shot, and his arms or somewhat sore  from that.   ALLERGIES:  No known drug allergies.   MEDICATIONS:  The patient does not know nor does he have a list with him.  His wife will bring the medications in for review.   PAST MEDICAL HISTORY:  1.  Notable for hypertension.  2.  Hyperlipidemia with unknown last lipid check.  3.  Prostate cancer with surgery in 1993.  4.  DJD.  5.  Hypothyroidism, unknown last TSH check.  6.  Hiatal hernia.  7.  GERD.  8.  Recent endoscopy this past summer.  9.  Coronary artery disease with cardiac catheterization on October 13, 1997, which  showed a 90% proximal LAD, 80% proximal diagonal #1, 70%      narrowing off the sub branch, 90% mid LAD, circumflex free of disease,      80 % distal LAD, minimal apical  hypokinesis, normal LV function. He      underwent four-vessel bypass surgery with a LIMA to LAD, saphenous vein      graft to the diagonal #1, saphenous vein graft to diagonal #2, saphenous      vein graft to the RCA.  According to his wife, the patient had a 75 site      CT scan in [place] in 2002 that she was told showed that he did not      have any blockages. His last stress Myoview was on January 07, 2005, and      showed EF 60%, no ischemia.   Surgical history is also notable for nose surgery, ear surgery x2, T&A.   SOCIAL HISTORY:  Resides in [place] with his wife. He has two children, one  grandchild, no great-grandchildren. 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 He is very active within his church, in the community,  and around the house. He denies any tobacco, alcohol, drug use. He has  multiple herbal supplements. He does watch a heart healthy diet. He does not  exercise specifically.   FAMILY HISTORY:  His mother died at the age of 56 from old age. History of  dementia. His father died in his 73s, unknown etiology. One sister deceased  with bacterial  meningitis. One sister alive, 14, with a history of multiple  strokes. One brother deceased at infancy.   REVIEW OF SYSTEMS:  Notable for seasonal allergies, occasional sinus  headache, glasses, dental crowns, nonproductive cough in the last several  days, nocturia. Right thumb and left pinkie arthralgias.  GERD symptoms and  occasional abdominal discomfort recently evaluated.   PHYSICAL EXAMINATION:  GENERAL:  Well-nourished, well-developed pleasant  white male accompanied by his wife in the hallway in the emergency room.  VITAL SIGNS: Temperature is 97, blood pressure 142/93, pulse 77 and regular,  respirations 18, 97% saturation on room air.  HEENT: Unremarkable.  NECK: Supple without thyromegaly, adenopathy, JVD or carotid bruits.  Lymphadenopathy is not present.  HEART:  PMI is not displaced. Regular rate and rhythm. Normal S1-S2.  Positive S4. Cannot appreciate any murmurs, rubs, clicks or gallops.  LUNGS: Symmetrical excursion. Lungs were clear to auscultation without  wheezing or rales.  SKIN:  Integument is intact.  ABDOMEN: Bowel sounds present without organomegaly, masses or tenderness. I  did not appreciate abdominal bruits.  EXTREMITIES: Negative cyanosis, clubbing or edema. Peripheral pulses are 2+  bilaterally.  MUSCULOSKELETAL: Unremarkable.  NEUROLOGIC:  Intact.   Chest x-ray is pending.   EKG shows normal sinus rhythm, left axis deviation, no acute changes from  2001.   Labs are pending.   IMPRESSION:  Prolonged and somewhat atypical chest discomfort, dyspnea on  exertion history as noted above.   DISPOSITION:  Edwin Mclean reviewed the patient's history, spoke with and  examined the patient, and agrees with the above. We will admit Edwin Mclean for  observation and rule out myocardial infarction. If enzymes and repeat EKG in  the morning are negative for myocardial infarction, he will be released with  outpatient stress Cardiolite and followup with Dr. Daleen Mclean.  We will also check his lipids, TSH, and D-dimer. His  wife is to bring in his home medications for comparison. His wife will also  bring a CT scan that was performed in [place] to add to his medical  records.      Joellyn Rued, P.A. [facility]    ______________________________  Edwin Mclean, M.D.  EW/MEDQ  D:  10/06/2005  T:  10/06/2005  Job:  [id number]   cc:   Dr. Lennette Bihari C. Mclean, M.D.  [address]  [address]  [place]  [place] [address]

## 2011-05-09 NOTE — H&P (Signed)
NAMEMASSIE, MEES NO.:  [id number]   MEDICAL RECORD NO.:  [id number]          PATIENT TYPE:  EMS   LOCATION:  MAJO                         FACILITY:  [facility]   PHYSICIAN:  Joellyn Rued, P.A. [facility] DATE OF BIRTH:  Jun 06, 1927   DATE OF ADMISSION:  10/06/2005  DATE OF DISCHARGE:                                HISTORY & PHYSICAL   No dictation for this job.      Joellyn Rued, P.A. [facility]     EW/MEDQ  D:  10/06/2005  T:  10/06/2005  Job:  [id number]

## 2011-05-12 ENCOUNTER — Telehealth: Payer: Self-pay | Admitting: *Deleted

## 2011-05-12 DIAGNOSIS — T148XXA Other injury of unspecified body region, initial encounter: Secondary | ICD-10-CM

## 2011-05-12 NOTE — Telephone Encounter (Signed)
Triage Record Num: [id number] Operator: Edythe Lynn Patient Name: Edwin Mclean Call Date & Time: 05/12/2011 5:12:03AM Patient Phone: [phone] PCP: Tillman Abide Patient Gender: Male PCP Fax : [phone] Patient DOB: 1927-01-18 Practice Name: [facility] Reason for Call: Aurea Graff RN from [facility] says pt fell, has cut above his ear and to the back of the head. Bleeding has stoped after bleeding a lot. Pt is Alzheimers pt and orientation is normal for pt/very hard to assess pt. Sent to ED. Protocol(s) Used: Head Injury Recommended Outcome per Protocol: See ED Immediately Reason for Outcome: Head injury AND 20 years of age or older Care Advice: ~ 05/

## 2011-05-12 NOTE — Telephone Encounter (Signed)
Resident seen today at [facility]

## 2011-05-14 DIAGNOSIS — K5900 Constipation, unspecified: Secondary | ICD-10-CM

## 2011-05-22 DIAGNOSIS — IMO0002 Reserved for concepts with insufficient information to code with codable children: Secondary | ICD-10-CM

## 2011-06-09 ENCOUNTER — Encounter: Payer: Self-pay | Admitting: Cardiovascular Disease

## 2011-07-11 DIAGNOSIS — E039 Hypothyroidism, unspecified: Secondary | ICD-10-CM

## 2011-07-11 DIAGNOSIS — G309 Alzheimer's disease, unspecified: Secondary | ICD-10-CM

## 2011-07-11 DIAGNOSIS — F0280 Dementia in other diseases classified elsewhere without behavioral disturbance: Secondary | ICD-10-CM

## 2011-07-11 DIAGNOSIS — I10 Essential (primary) hypertension: Secondary | ICD-10-CM

## 2011-09-04 DIAGNOSIS — I2510 Atherosclerotic heart disease of native coronary artery without angina pectoris: Secondary | ICD-10-CM

## 2011-09-04 DIAGNOSIS — G309 Alzheimer's disease, unspecified: Secondary | ICD-10-CM

## 2011-09-04 DIAGNOSIS — F0280 Dementia in other diseases classified elsewhere without behavioral disturbance: Secondary | ICD-10-CM

## 2011-09-04 DIAGNOSIS — K5909 Other constipation: Secondary | ICD-10-CM

## 2011-09-04 DIAGNOSIS — G479 Sleep disorder, unspecified: Secondary | ICD-10-CM

## 2011-09-26 DIAGNOSIS — I10 Essential (primary) hypertension: Secondary | ICD-10-CM

## 2011-09-26 DIAGNOSIS — F329 Major depressive disorder, single episode, unspecified: Secondary | ICD-10-CM

## 2011-10-30 DIAGNOSIS — F0280 Dementia in other diseases classified elsewhere without behavioral disturbance: Secondary | ICD-10-CM

## 2011-10-30 DIAGNOSIS — M159 Polyosteoarthritis, unspecified: Secondary | ICD-10-CM

## 2011-10-30 DIAGNOSIS — G479 Sleep disorder, unspecified: Secondary | ICD-10-CM

## 2011-10-30 DIAGNOSIS — I2510 Atherosclerotic heart disease of native coronary artery without angina pectoris: Secondary | ICD-10-CM

## 2011-10-30 DIAGNOSIS — G309 Alzheimer's disease, unspecified: Secondary | ICD-10-CM

## 2012-01-12 DIAGNOSIS — F0280 Dementia in other diseases classified elsewhere without behavioral disturbance: Secondary | ICD-10-CM

## 2012-01-12 DIAGNOSIS — I2510 Atherosclerotic heart disease of native coronary artery without angina pectoris: Secondary | ICD-10-CM

## 2012-01-12 DIAGNOSIS — G479 Sleep disorder, unspecified: Secondary | ICD-10-CM

## 2012-01-12 DIAGNOSIS — M159 Polyosteoarthritis, unspecified: Secondary | ICD-10-CM

## 2012-01-12 DIAGNOSIS — G309 Alzheimer's disease, unspecified: Secondary | ICD-10-CM

## 2012-03-11 DIAGNOSIS — I2510 Atherosclerotic heart disease of native coronary artery without angina pectoris: Secondary | ICD-10-CM

## 2012-03-11 DIAGNOSIS — G479 Sleep disorder, unspecified: Secondary | ICD-10-CM

## 2012-03-11 DIAGNOSIS — M159 Polyosteoarthritis, unspecified: Secondary | ICD-10-CM

## 2012-03-11 DIAGNOSIS — F0280 Dementia in other diseases classified elsewhere without behavioral disturbance: Secondary | ICD-10-CM

## 2012-03-11 DIAGNOSIS — G309 Alzheimer's disease, unspecified: Secondary | ICD-10-CM

## 2012-05-06 DIAGNOSIS — I2510 Atherosclerotic heart disease of native coronary artery without angina pectoris: Secondary | ICD-10-CM

## 2012-05-06 DIAGNOSIS — H179 Unspecified corneal scar and opacity: Secondary | ICD-10-CM

## 2012-05-06 DIAGNOSIS — G479 Sleep disorder, unspecified: Secondary | ICD-10-CM

## 2012-05-06 DIAGNOSIS — M159 Polyosteoarthritis, unspecified: Secondary | ICD-10-CM

## 2012-07-01 DIAGNOSIS — G309 Alzheimer's disease, unspecified: Secondary | ICD-10-CM

## 2012-07-01 DIAGNOSIS — IMO0002 Reserved for concepts with insufficient information to code with codable children: Secondary | ICD-10-CM

## 2012-07-01 DIAGNOSIS — I2510 Atherosclerotic heart disease of native coronary artery without angina pectoris: Secondary | ICD-10-CM

## 2012-07-01 DIAGNOSIS — F0280 Dementia in other diseases classified elsewhere without behavioral disturbance: Secondary | ICD-10-CM

## 2012-07-01 DIAGNOSIS — R079 Chest pain, unspecified: Secondary | ICD-10-CM

## 2012-09-02 DIAGNOSIS — G309 Alzheimer's disease, unspecified: Secondary | ICD-10-CM

## 2012-09-02 DIAGNOSIS — G479 Sleep disorder, unspecified: Secondary | ICD-10-CM

## 2012-09-02 DIAGNOSIS — F0280 Dementia in other diseases classified elsewhere without behavioral disturbance: Secondary | ICD-10-CM

## 2012-09-02 DIAGNOSIS — I2510 Atherosclerotic heart disease of native coronary artery without angina pectoris: Secondary | ICD-10-CM

## 2012-09-02 DIAGNOSIS — IMO0002 Reserved for concepts with insufficient information to code with codable children: Secondary | ICD-10-CM

## 2012-11-08 DIAGNOSIS — G479 Sleep disorder, unspecified: Secondary | ICD-10-CM

## 2012-11-08 DIAGNOSIS — IMO0002 Reserved for concepts with insufficient information to code with codable children: Secondary | ICD-10-CM

## 2012-11-08 DIAGNOSIS — I2510 Atherosclerotic heart disease of native coronary artery without angina pectoris: Secondary | ICD-10-CM

## 2012-11-08 DIAGNOSIS — G309 Alzheimer's disease, unspecified: Secondary | ICD-10-CM

## 2012-11-08 DIAGNOSIS — F0280 Dementia in other diseases classified elsewhere without behavioral disturbance: Secondary | ICD-10-CM

## 2012-12-02 ENCOUNTER — Telehealth: Payer: Self-pay

## 2012-12-02 NOTE — Telephone Encounter (Signed)
pts wife left v/m requesting letter that pt is incapacitated and not capable of acting on behalf of himself; this is needed for financial institution; Mrs Cranston is switching some accounts.Pt is a resident at [facility] care at [facility]. Call Mrs Loper when letter is ready for pickup.-

## 2012-12-03 ENCOUNTER — Encounter: Payer: Self-pay | Admitting: Internal Medicine

## 2012-12-03 DIAGNOSIS — Z0279 Encounter for issue of other medical certificate: Secondary | ICD-10-CM

## 2012-12-03 NOTE — Telephone Encounter (Signed)
Letter done $20 charge 

## 2012-12-03 NOTE — Telephone Encounter (Signed)
Patient's wife notified form is ready and of charge.  I mailed letter to patient's wife.

## 2012-12-06 DIAGNOSIS — L570 Actinic keratosis: Secondary | ICD-10-CM

## 2012-12-30 DIAGNOSIS — D239 Other benign neoplasm of skin, unspecified: Secondary | ICD-10-CM

## 2013-02-21 DIAGNOSIS — F0281 Dementia in other diseases classified elsewhere with behavioral disturbance: Secondary | ICD-10-CM

## 2013-02-21 DIAGNOSIS — IMO0002 Reserved for concepts with insufficient information to code with codable children: Secondary | ICD-10-CM

## 2013-02-21 DIAGNOSIS — I2510 Atherosclerotic heart disease of native coronary artery without angina pectoris: Secondary | ICD-10-CM

## 2013-02-21 DIAGNOSIS — G479 Sleep disorder, unspecified: Secondary | ICD-10-CM

## 2013-03-21 DIAGNOSIS — L570 Actinic keratosis: Secondary | ICD-10-CM

## 2013-05-19 DIAGNOSIS — G309 Alzheimer's disease, unspecified: Secondary | ICD-10-CM

## 2013-05-19 DIAGNOSIS — I2510 Atherosclerotic heart disease of native coronary artery without angina pectoris: Secondary | ICD-10-CM

## 2013-05-19 DIAGNOSIS — IMO0002 Reserved for concepts with insufficient information to code with codable children: Secondary | ICD-10-CM

## 2013-05-19 DIAGNOSIS — F0280 Dementia in other diseases classified elsewhere without behavioral disturbance: Secondary | ICD-10-CM

## 2013-05-19 DIAGNOSIS — G479 Sleep disorder, unspecified: Secondary | ICD-10-CM

## 2013-05-23 ENCOUNTER — Telehealth: Payer: Self-pay | Admitting: *Deleted

## 2013-05-23 NOTE — Telephone Encounter (Signed)
Triage Record Num: [id number] Operator: Meribeth Mattes Patient Name: Edwin Mclean Call Date & Time: 05/21/2013 7:10:56AM Patient Phone: [phone] PCP: Tillman Abide Patient Gender: Male PCP Fax : [phone] Patient DOB: 1926-12-31 Practice Name: [facility] Reason for Call: Caller: Sharolyn/RN; PCP: Tillman Abide (Family Practice); CB#: [phone]; Call regarding Swallowed foaming hand soap; unsure of how much he ingested, maybe 1-2 ounces, VSS, no diff breathing, has had intermittent cough Triage per Poisoning Protocol. Call Poison Center Immediately disposition. Connected caller with Engineering geologist at [facility]. Protocol(s) Used: Poisoning Recommended Outcome per Protocol: Call [facility] Immediately Reason for Outcome: Exposure to irritants AND minor symptoms such as burning/tearing of eyes; burning sensation of nose/throat; sore throat or intermittent cough Care Advice: ~

## 2013-05-23 NOTE — Telephone Encounter (Signed)
Reviewed today at [facility] He is doing fine

## 2013-06-27 ENCOUNTER — Telehealth: Payer: Self-pay | Admitting: Family Medicine

## 2013-06-27 DIAGNOSIS — S63509A Unspecified sprain of unspecified wrist, initial encounter: Secondary | ICD-10-CM

## 2013-06-27 NOTE — Telephone Encounter (Signed)
Seem today Hand is better

## 2013-06-27 NOTE — Telephone Encounter (Signed)
[facility] Triage Call Report Triage Record Num: [id number] Operator: Geanie Berlin Patient Name: Edwin Mclean Call Date & Time: 06/24/2013 4:04:59PM Patient Phone: [phone] PCP: Tillman Abide Patient Gender: Male PCP Fax : [phone] Patient DOB: 05-09-1927 Practice Name[facility] Reason for Call: Caller: Mable Fill [facility] Care/Other/RN; PCP: Tillman Abide [facility]); CB#: [phone]; 2nd call: regarding Fall yesterday on day shift; Requesting xray of right wrist (has orders DO NOT SEND PT OUT); right wrist red, swollen and painful. No information about where it occurred. Stepped on own shoe and sat of the floor in facility; possibly in his room. Vital signs: TPR: 96.4 Ax 77-20 124/75. Refuses to allow staff to apply ice to injured wrist. Using hand/wrist. Mobile xray ordered of right hand/wrist ordered per MD protocol for extremity swelling and taking blood thinner (ASA) per Wrist Injury guideline. Protocol(s) Used: Wrist Injury Recommended Outcome per Protocol: See ED Immediately Override Outcome if Used in Protocol: Urgent Home Treatment with Follow-Up Call RN Reason for Override Outcome: Physician Orders. Reason for Outcome: Extremity swelling or limitation of range of motion AND known bleeding disorder OR taking blood thinner, chemotherapy or transplant medications Care Advice: ~ Remove any rings on the fingers of the affected hand, if possible. ~ Apply cloth-covered ice pack or a cool compress to the area while in transit to reduce pain and swelling. ~ IMMEDIATE ACTION ~ DO NOT try to straighten or realign a misshaped bone or joint. 06/24/2013 4:20:05PM Page 1 of 1 CAN_TriageRpt_V2

## 2013-07-22 ENCOUNTER — Emergency Department: Payer: Self-pay

## 2013-07-22 LAB — COMPREHENSIVE METABOLIC PANEL
Albumin: 3.7 g/dL (ref 3.4–5.0)
Alkaline Phosphatase: 100 U/L (ref 50–136)
Anion Gap: 6 — ABNORMAL LOW (ref 7–16)
BUN: 21 mg/dL — ABNORMAL HIGH (ref 7–18)
Bilirubin,Total: 0.2 mg/dL (ref 0.2–1.0)
Calcium, Total: 9.1 mg/dL (ref 8.5–10.1)
Chloride: 111 mmol/L — ABNORMAL HIGH (ref 98–107)
Co2: 24 mmol/L (ref 21–32)
Creatinine: 0.95 mg/dL (ref 0.60–1.30)
EGFR (African American): >60
EGFR (Non-African Amer.): >60
Glucose: 121 mg/dL — ABNORMAL HIGH (ref 65–99)
Osmolality: 285 (ref 275–301)
Potassium: 3.8 mmol/L (ref 3.5–5.1)
SGOT(AST): 23 U/L (ref 15–37)
SGPT (ALT): 30 U/L (ref 12–78)
Sodium: 141 mmol/L (ref 136–145)
Total Protein: 7 g/dL (ref 6.4–8.2)

## 2013-07-22 LAB — CBC
HCT: 39.7 % — ABNORMAL LOW (ref 40.0–52.0)
HGB: 13.6 g/dL (ref 13.0–18.0)
MCH: 33.3 pg (ref 26.0–34.0)
MCHC: 34.2 g/dL (ref 32.0–36.0)
MCV: 97 fL (ref 80–100)
Platelet: 217 10*3/uL (ref 150–440)
RBC: 4.08 10*6/uL — ABNORMAL LOW (ref 4.40–5.90)
RDW: 13.1 % (ref 11.5–14.5)
WBC: 12.9 10*3/uL — ABNORMAL HIGH (ref 3.8–10.6)

## 2013-07-22 LAB — LIPASE, BLOOD: Lipase: 207 U/L (ref 73–393)

## 2013-07-25 ENCOUNTER — Telehealth: Payer: Self-pay | Admitting: Family Medicine

## 2013-07-25 NOTE — Telephone Encounter (Signed)
Will round at [facility] this afternoon.

## 2013-07-25 NOTE — Telephone Encounter (Signed)
[place] pt, nursing home

## 2013-07-25 NOTE — Telephone Encounter (Signed)
[facility] Triage Call Report Triage Record Num: [id number] Operator: Maryfrances Bunnell Patient Name: Edwin Mclean Call Date & Time: 07/22/2013 7:45:27PM Patient Phone: [phone] PCP: Tillman Abide Patient Gender: Male PCP Fax : [phone] Patient DOB: [birth year] Practice Name[facility] Reason for Call: Caller: Hattie/Lpn; PCP: Tillman Abide (Family Practice); CB#: [phone]; Call regarding Gi bleeding; vomited blood; Vomiting x 2 tonight; Blood noted, more than a scant amount; V/S 1900 175/91; HR 116; rr 24; T 97 ax; 1930: 126/72; hr 103; T 98.5 ax; Abd is not distended, positive bowel sounds, no tenderness; Currently on Asa and Pepcid; Facility denies patient eating anything red or pink tonight; Patient not able to verbalize or make needs known; Wife notified and expecting him to be sent in if needed; Per Nausea or Vomiting Protocol "Vomiting red, bloody or coffee-ground material, more than streaks of blood or scant amount (not following nosebleed within past day)" advised to send in via ems 911 per disposition for MD evaluation. Protocol(s) Used: Nausea or Vomiting Recommended Outcome per Protocol: Activate EMS 911 Reason for Outcome: Vomiting red, bloody or coffee-ground material, more than streaks of blood or scant amount (not following nosebleed within past day) Care Advice: ~ IMMEDIATE ACTION 07/22/2013 8:00:00PM Page 1 of 1 CAN_TriageRpt_V2

## 2013-08-29 DIAGNOSIS — G479 Sleep disorder, unspecified: Secondary | ICD-10-CM

## 2013-08-29 DIAGNOSIS — I2510 Atherosclerotic heart disease of native coronary artery without angina pectoris: Secondary | ICD-10-CM

## 2013-08-29 DIAGNOSIS — F0280 Dementia in other diseases classified elsewhere without behavioral disturbance: Secondary | ICD-10-CM

## 2013-08-29 DIAGNOSIS — G309 Alzheimer's disease, unspecified: Secondary | ICD-10-CM

## 2013-08-29 DIAGNOSIS — IMO0002 Reserved for concepts with insufficient information to code with codable children: Secondary | ICD-10-CM

## 2013-09-02 DIAGNOSIS — J069 Acute upper respiratory infection, unspecified: Secondary | ICD-10-CM

## 2013-11-02 DIAGNOSIS — IMO0002 Reserved for concepts with insufficient information to code with codable children: Secondary | ICD-10-CM

## 2013-11-02 DIAGNOSIS — M1710 Unilateral primary osteoarthritis, unspecified knee: Secondary | ICD-10-CM

## 2013-11-02 DIAGNOSIS — N052 Unspecified nephritic syndrome with diffuse membranous glomerulonephritis: Secondary | ICD-10-CM

## 2013-11-02 DIAGNOSIS — I2510 Atherosclerotic heart disease of native coronary artery without angina pectoris: Secondary | ICD-10-CM

## 2013-11-02 DIAGNOSIS — F0280 Dementia in other diseases classified elsewhere without behavioral disturbance: Secondary | ICD-10-CM

## 2013-11-14 DIAGNOSIS — S0510XA Contusion of eyeball and orbital tissues, unspecified eye, initial encounter: Secondary | ICD-10-CM

## 2013-12-27 DIAGNOSIS — G309 Alzheimer's disease, unspecified: Secondary | ICD-10-CM

## 2013-12-27 DIAGNOSIS — N052 Unspecified nephritic syndrome with diffuse membranous glomerulonephritis: Secondary | ICD-10-CM

## 2013-12-27 DIAGNOSIS — G479 Sleep disorder, unspecified: Secondary | ICD-10-CM

## 2013-12-27 DIAGNOSIS — M1710 Unilateral primary osteoarthritis, unspecified knee: Secondary | ICD-10-CM

## 2013-12-27 DIAGNOSIS — F0280 Dementia in other diseases classified elsewhere without behavioral disturbance: Secondary | ICD-10-CM

## 2013-12-27 DIAGNOSIS — I2510 Atherosclerotic heart disease of native coronary artery without angina pectoris: Secondary | ICD-10-CM

## 2013-12-27 DIAGNOSIS — IMO0002 Reserved for concepts with insufficient information to code with codable children: Secondary | ICD-10-CM

## 2013-12-27 DIAGNOSIS — E039 Hypothyroidism, unspecified: Secondary | ICD-10-CM

## 2014-02-21 DIAGNOSIS — IMO0002 Reserved for concepts with insufficient information to code with codable children: Secondary | ICD-10-CM

## 2014-02-21 DIAGNOSIS — E039 Hypothyroidism, unspecified: Secondary | ICD-10-CM

## 2014-02-21 DIAGNOSIS — G309 Alzheimer's disease, unspecified: Secondary | ICD-10-CM

## 2014-02-21 DIAGNOSIS — F0280 Dementia in other diseases classified elsewhere without behavioral disturbance: Secondary | ICD-10-CM

## 2014-02-21 DIAGNOSIS — M1710 Unilateral primary osteoarthritis, unspecified knee: Secondary | ICD-10-CM

## 2014-02-21 DIAGNOSIS — I2510 Atherosclerotic heart disease of native coronary artery without angina pectoris: Secondary | ICD-10-CM

## 2014-02-21 DIAGNOSIS — N052 Unspecified nephritic syndrome with diffuse membranous glomerulonephritis: Secondary | ICD-10-CM

## 2014-02-21 DIAGNOSIS — F0150 Vascular dementia without behavioral disturbance: Secondary | ICD-10-CM

## 2014-02-21 DIAGNOSIS — G478 Other sleep disorders: Secondary | ICD-10-CM

## 2014-03-06 DIAGNOSIS — R05 Cough: Secondary | ICD-10-CM

## 2014-03-06 DIAGNOSIS — R059 Cough, unspecified: Secondary | ICD-10-CM

## 2014-04-26 DIAGNOSIS — M1710 Unilateral primary osteoarthritis, unspecified knee: Secondary | ICD-10-CM

## 2014-04-26 DIAGNOSIS — F430 Acute stress reaction: Secondary | ICD-10-CM

## 2014-04-26 DIAGNOSIS — G479 Sleep disorder, unspecified: Secondary | ICD-10-CM

## 2014-04-26 DIAGNOSIS — IMO0002 Reserved for concepts with insufficient information to code with codable children: Secondary | ICD-10-CM

## 2014-04-26 DIAGNOSIS — E039 Hypothyroidism, unspecified: Secondary | ICD-10-CM

## 2014-04-26 DIAGNOSIS — F0280 Dementia in other diseases classified elsewhere without behavioral disturbance: Secondary | ICD-10-CM

## 2014-04-26 DIAGNOSIS — N052 Unspecified nephritic syndrome with diffuse membranous glomerulonephritis: Secondary | ICD-10-CM

## 2014-04-26 DIAGNOSIS — G309 Alzheimer's disease, unspecified: Secondary | ICD-10-CM

## 2014-07-05 DIAGNOSIS — G309 Alzheimer's disease, unspecified: Secondary | ICD-10-CM

## 2014-07-05 DIAGNOSIS — N052 Unspecified nephritic syndrome with diffuse membranous glomerulonephritis: Secondary | ICD-10-CM

## 2014-07-05 DIAGNOSIS — G479 Sleep disorder, unspecified: Secondary | ICD-10-CM

## 2014-07-05 DIAGNOSIS — F0280 Dementia in other diseases classified elsewhere without behavioral disturbance: Secondary | ICD-10-CM

## 2014-07-05 DIAGNOSIS — M1710 Unilateral primary osteoarthritis, unspecified knee: Secondary | ICD-10-CM

## 2014-07-05 DIAGNOSIS — IMO0002 Reserved for concepts with insufficient information to code with codable children: Secondary | ICD-10-CM

## 2014-07-05 DIAGNOSIS — E039 Hypothyroidism, unspecified: Secondary | ICD-10-CM

## 2014-09-06 DIAGNOSIS — F0280 Dementia in other diseases classified elsewhere without behavioral disturbance: Secondary | ICD-10-CM

## 2014-09-06 DIAGNOSIS — F430 Acute stress reaction: Secondary | ICD-10-CM

## 2014-09-06 DIAGNOSIS — G309 Alzheimer's disease, unspecified: Secondary | ICD-10-CM

## 2014-09-06 DIAGNOSIS — N052 Unspecified nephritic syndrome with diffuse membranous glomerulonephritis: Secondary | ICD-10-CM

## 2014-09-06 DIAGNOSIS — I2510 Atherosclerotic heart disease of native coronary artery without angina pectoris: Secondary | ICD-10-CM

## 2014-09-06 DIAGNOSIS — G479 Sleep disorder, unspecified: Secondary | ICD-10-CM

## 2014-09-06 DIAGNOSIS — IMO0002 Reserved for concepts with insufficient information to code with codable children: Secondary | ICD-10-CM

## 2014-09-06 DIAGNOSIS — M1710 Unilateral primary osteoarthritis, unspecified knee: Secondary | ICD-10-CM

## 2014-09-06 DIAGNOSIS — E039 Hypothyroidism, unspecified: Secondary | ICD-10-CM

## 2014-10-27 DIAGNOSIS — R451 Restlessness and agitation: Secondary | ICD-10-CM

## 2014-10-27 DIAGNOSIS — G479 Sleep disorder, unspecified: Secondary | ICD-10-CM

## 2014-10-27 DIAGNOSIS — M1990 Unspecified osteoarthritis, unspecified site: Secondary | ICD-10-CM

## 2014-10-27 DIAGNOSIS — I2510 Atherosclerotic heart disease of native coronary artery without angina pectoris: Secondary | ICD-10-CM

## 2014-10-27 DIAGNOSIS — E039 Hypothyroidism, unspecified: Secondary | ICD-10-CM

## 2014-10-27 DIAGNOSIS — K219 Gastro-esophageal reflux disease without esophagitis: Secondary | ICD-10-CM

## 2014-10-27 DIAGNOSIS — G309 Alzheimer's disease, unspecified: Secondary | ICD-10-CM

## 2014-12-29 DIAGNOSIS — G479 Sleep disorder, unspecified: Secondary | ICD-10-CM

## 2014-12-29 DIAGNOSIS — R451 Restlessness and agitation: Secondary | ICD-10-CM

## 2014-12-29 DIAGNOSIS — K219 Gastro-esophageal reflux disease without esophagitis: Secondary | ICD-10-CM

## 2014-12-29 DIAGNOSIS — I2510 Atherosclerotic heart disease of native coronary artery without angina pectoris: Secondary | ICD-10-CM

## 2014-12-29 DIAGNOSIS — G309 Alzheimer's disease, unspecified: Secondary | ICD-10-CM

## 2014-12-29 DIAGNOSIS — M1990 Unspecified osteoarthritis, unspecified site: Secondary | ICD-10-CM

## 2014-12-29 DIAGNOSIS — E039 Hypothyroidism, unspecified: Secondary | ICD-10-CM

## 2015-01-17 DIAGNOSIS — S90221A Contusion of right lesser toe(s) with damage to nail, initial encounter: Secondary | ICD-10-CM

## 2015-01-17 DIAGNOSIS — M79671 Pain in right foot: Secondary | ICD-10-CM

## 2015-01-25 DIAGNOSIS — G309 Alzheimer's disease, unspecified: Secondary | ICD-10-CM

## 2015-01-25 DIAGNOSIS — K219 Gastro-esophageal reflux disease without esophagitis: Secondary | ICD-10-CM

## 2015-01-25 DIAGNOSIS — E039 Hypothyroidism, unspecified: Secondary | ICD-10-CM

## 2015-01-25 DIAGNOSIS — G479 Sleep disorder, unspecified: Secondary | ICD-10-CM

## 2015-01-25 DIAGNOSIS — I2510 Atherosclerotic heart disease of native coronary artery without angina pectoris: Secondary | ICD-10-CM

## 2015-02-21 DIAGNOSIS — K219 Gastro-esophageal reflux disease without esophagitis: Secondary | ICD-10-CM | POA: Diagnosis not present

## 2015-02-21 DIAGNOSIS — G479 Sleep disorder, unspecified: Secondary | ICD-10-CM | POA: Diagnosis not present

## 2015-02-21 DIAGNOSIS — M1990 Unspecified osteoarthritis, unspecified site: Secondary | ICD-10-CM | POA: Diagnosis not present

## 2015-02-21 DIAGNOSIS — F0390 Unspecified dementia without behavioral disturbance: Secondary | ICD-10-CM

## 2015-02-21 DIAGNOSIS — G309 Alzheimer's disease, unspecified: Secondary | ICD-10-CM

## 2015-02-21 DIAGNOSIS — I2510 Atherosclerotic heart disease of native coronary artery without angina pectoris: Secondary | ICD-10-CM | POA: Diagnosis not present

## 2015-03-26 ENCOUNTER — Telehealth: Payer: Self-pay | Admitting: *Deleted

## 2015-03-26 NOTE — Telephone Encounter (Signed)
PLEASE NOTE: All timestamps contained within this report are represented as [place] Standard Time. CONFIDENTIALTY NOTICE: This fax transmission is intended only for the addressee. It contains information that is legally privileged, confidential or otherwise protected from use or disclosure. If you are not the intended recipient, you are strictly prohibited from reviewing, disclosing, copying using or disseminating any of this information or taking any action in reliance on or regarding this information. If you have received this fax in error, please notify us immediately by telephone so that we can arrange for its return to [place]. Phone: [phone], Toll-Free: [phone], Fax: [phone] Page: 1 of 1 Call Id: [id number] Edwin Mclean Patient Name: Edwin Mclean Gender: Male DOB: 26-Oct-1927 Age: 22 Y 3 M 8 D Return Phone Number: Address: City/State/Zip: [place] Client [place] Night - Client Client Site [place] Physician Viviana Simpler Contact Type Call Call Type Page Only Caller Name Juliann Pulse Relationship To Patient Provider Is this call to report lab results? No Return Phone Number Unavailable Initial Comment Caller states Juliann Pulse from [facility] memory care and needs prescription filled for a resident that is rather urgent.Patient is in lots of pain. Cb# [phone] Nurse Assessment Guidelines Guideline Title Affirmed Question Affirmed Notes Nurse Date/Time Eilene Ghazi Time) Disp. Time Eilene Ghazi Time) Disposition Final User 03/24/2015 3:37:22 PM Send to [place], Daniel 03/24/2015 3:41:29 PM Paged On Call back to Call [place], Calcium 03/24/2015 3:41:38 PM Paged On Call back to Call [place] Renato Shin 03/24/2015 3:45:24 PM Page Completed Yes Renato Shin After Care Instructions Given Call Event Type User Date / Time  Description Paging DoctorName DoctorPhone DateTime Result/Outcome Notes Garnet Koyanagi [phone] 03/24/2015 3:41:29 PM Paged On Call Back to Call [place] at [phone] Garnet Koyanagi 03/24/2015 3:46:00 PM Spoke with On Call - General Connected on call with caller.

## 2015-03-26 NOTE — Telephone Encounter (Signed)
PLEASE NOTE: All timestamps contained within this report are represented as [place] Standard Time. CONFIDENTIALTY NOTICE: This fax transmission is intended only for the addressee. It contains information that is legally privileged, confidential or otherwise protected from use or disclosure. If you are not the intended recipient, you are strictly prohibited from reviewing, disclosing, copying using or disseminating any of this information or taking any action in reliance on or regarding this information. If you have received this fax in error, please notify us immediately by telephone so that we can arrange for its return to [place]. Phone: [phone], Toll-Free: [phone], Fax: [phone] Page: 1 of 1 Call Id: [id number] [place] Patient Name: Edwin Mclean Gender: Male DOB: [birth year] Age: 81 Y 3 M 8 D Return Phone Number: Address: City/State/Zip: [place] Client [place] Night - Client Client Site [place] Physician Viviana Simpler Contact Type Call Call Type Page Only Caller Name [facility] Relationship To Patient Provider Is this call to report lab results? 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 She has PT [name] [birth year][address] Silvio Pate. She needs medication orders. CB# [phone] Nurse Assessment Guidelines Guideline Title Affirmed Question Affirmed Notes Nurse Date/Time Eilene Ghazi Time) Disp. Time Eilene Ghazi Time) Disposition Final User 03/24/2015 11:51:41 AM Send to [facility] Paging Queue Ricard Dillon 03/24/2015 12:02:37 PM Paged On Call back to [place], Amy 03/24/2015 12:05:18 PM Page Completed Sandrea Hammond, Amy After Care Instructions Given Call Event Type User Date / Time Description Paging DoctorName DoctorPhone DateTime Result/Outcome Notes Garnet Koyanagi [phone] 03/24/2015 12:02:37 PM Paged On Call Back to Call Center Please call Amy at [phone] Garnet Koyanagi 03/24/2015 12:05:06 PM Spoke with On Call - General

## 2015-03-26 NOTE — Telephone Encounter (Signed)
Seen this morning Actively dying Discussed with his wife

## 2015-04-22 DEATH — deceased

## 2015-08-24 ENCOUNTER — Encounter: Payer: Self-pay | Admitting: Internal Medicine
